# Patient Record
Sex: Male | Born: 1935 | Race: Black or African American | Hispanic: No | Smoking: Never smoker
Health system: Southern US, Community
[De-identification: ages and names within clinical notes are randomized; demographics above are authoritative.]

## PROBLEM LIST (undated history)

## (undated) DIAGNOSIS — I2510 Atherosclerotic heart disease of native coronary artery without angina pectoris: Secondary | ICD-10-CM

## (undated) DIAGNOSIS — M1990 Unspecified osteoarthritis, unspecified site: Secondary | ICD-10-CM

## (undated) DIAGNOSIS — I10 Essential (primary) hypertension: Secondary | ICD-10-CM

## (undated) DIAGNOSIS — E119 Type 2 diabetes mellitus without complications: Secondary | ICD-10-CM

## (undated) DIAGNOSIS — I639 Cerebral infarction, unspecified: Secondary | ICD-10-CM

---

## 2014-07-23 HISTORY — PX: OTHER SURGICAL HISTORY: SHX169

## 2018-09-09 ENCOUNTER — Encounter: Payer: Self-pay | Admitting: Gastroenterology

## 2018-09-09 ENCOUNTER — Telehealth: Payer: Self-pay

## 2018-09-09 NOTE — Telephone Encounter (Signed)
Called and scheduled

## 2018-09-09 NOTE — Telephone Encounter (Addendum)
Copied from CRM [id number]. Topic: Appointments - Schedule Appointment  >> Sep 09, 2018 11:59 AM Corlis Leak wrote:  Perlie Gold from [facility] calling to make a new patient appointment to be seen for rash.    Patient has BS insurance.    Please call Perlie Gold at [phone], ext. [phone] to schedule an appointment.

## 2018-10-20 ENCOUNTER — Telehealth: Payer: Self-pay

## 2018-10-20 ENCOUNTER — Other Ambulatory Visit: Payer: Self-pay | Admitting: Dermatology

## 2018-10-20 NOTE — Telephone Encounter (Signed)
Copied from CRM [id number]. Topic: Appointments - Reschedule Appointment  >> Oct 20, 2018  2:58 PM Pablo Ledger wrote:  Daughter is calling for the patient and asking if we can see this patient sooner than June.  He has a rash all over the body and on his inner thighs. It is especially bad on his bottom, states when he sits on the toilet, when he gets up there is a bunch of scabs on the seat and there is blood.   The rash is bleeding in some areas, please call to discuss . [phone] Gay Filler is his daughter.

## 2018-10-20 NOTE — Telephone Encounter (Signed)
Called patient daughter, offered telehome encounter given COVID pandemic. Patient's daughter is interested. She will send images. Told the patient's daughter we will reach back out to her after images arte reviewed for full history and recommendations.    Silver Huguenin, MD  Dermatology Resident  3:50 PM  10/20/18

## 2018-10-20 NOTE — Progress Notes (Signed)
Orders only encounter to facilitate image upload.    Silver Huguenin, MD  Dermatology Resident  3:51 PM  10/20/18

## 2018-10-29 ENCOUNTER — Telehealth: Payer: Self-pay | Admitting: MOHS-Micrographic Surgery

## 2018-10-29 NOTE — Telephone Encounter (Signed)
Patient called 3x to remind him to upload photo for rash. Was unable to reach patient during any attempts to perform telehome vist. After 7 days, no photo has been uploaded.     Per policy, will remove patient from triage list at this time and can add back on if patient calls back in the future.

## 2018-11-03 ENCOUNTER — Other Ambulatory Visit
Admission: RE | Admit: 2018-11-03 | Discharge: 2018-11-03 | Disposition: A | Discharge status: Home / Self Care | Payer: Medicare Other | Source: Ambulatory Visit | Attending: Family Medicine | Admitting: Family Medicine

## 2018-11-03 DIAGNOSIS — R21 Rash and other nonspecific skin eruption: Secondary | ICD-10-CM | POA: Insufficient documentation

## 2018-11-03 DIAGNOSIS — I10 Essential (primary) hypertension: Secondary | ICD-10-CM | POA: Insufficient documentation

## 2018-11-03 DIAGNOSIS — E119 Type 2 diabetes mellitus without complications: Secondary | ICD-10-CM | POA: Insufficient documentation

## 2018-11-03 LAB — CRP: CRP: 6 mg/L (ref 0–10)

## 2018-11-03 LAB — SEDIMENTATION RATE, AUTOMATED: Sedimentation Rate: 39 mm/hr — ABNORMAL HIGH (ref 0–20)

## 2018-11-03 LAB — GLUCOSE: Glucose: 129 mg/dL — ABNORMAL HIGH (ref 60–99)

## 2018-11-03 LAB — BASIC METABOLIC PANEL
Anion Gap: 11 (ref 7–16)
CO2: 26 mmol/L (ref 20–28)
Calcium: 9.1 mg/dL (ref 8.6–10.2)
Chloride: 104 mmol/L (ref 96–108)
Creatinine: 0.99 mg/dL (ref 0.67–1.17)
GFR,Black: 81 *
GFR,Caucasian: 70 *
Lab: 17 mg/dL (ref 6–20)
Potassium: 4.2 mmol/L (ref 3.3–5.1)
Sodium: 141 mmol/L (ref 133–145)

## 2018-11-03 LAB — HEMOGLOBIN A1C: Hemoglobin A1C: 6.9 % — ABNORMAL HIGH

## 2018-11-04 LAB — ANTINUCLEAR ANTIBODY SCREEN: ANA Screen: NEGATIVE

## 2018-11-06 ENCOUNTER — Telehealth: Payer: Self-pay

## 2018-11-06 NOTE — Telephone Encounter (Signed)
Copied from CRM [id number]. Topic: Appointments - Schedule Appointment  >> Nov 06, 2018 11:25 AM Filiberto Pinks wrote:  [facility] has a scheduled appointment on 06/03 with Dr. Bethann Berkshire. The patient would like a sooner appointment if possible to be seen for Possible Fungal infections on patients legs and arms and bottom, itching. [facility] can be reached at [phone].

## 2018-12-24 ENCOUNTER — Ambulatory Visit: Payer: Medicare Other | Attending: Dermatology | Admitting: Dermatology

## 2018-12-24 ENCOUNTER — Encounter: Payer: Self-pay | Admitting: Dermatology

## 2018-12-24 VITALS — Temp 98.5°F | Ht 69.0 in

## 2018-12-24 DIAGNOSIS — L408 Other psoriasis: Secondary | ICD-10-CM

## 2018-12-24 DIAGNOSIS — R21 Rash and other nonspecific skin eruption: Secondary | ICD-10-CM | POA: Insufficient documentation

## 2018-12-24 MED ORDER — TRIAMCINOLONE ACETONIDE 0.1 % EX OINT *I*
TOPICAL_OINTMENT | Freq: Two times a day (BID) | CUTANEOUS | 1 refills | Status: AC
Start: 2018-12-24 — End: ?

## 2018-12-24 NOTE — Patient Instructions (Signed)
Start triamcinolone 0.1% ointment, apply to all the scaly spots on your arms, lower back, and legs 1-2x/day   - you can stop this ointment if your rash goes away  This medication is a topical steroid. Steroid should generally not be used on the face, in armpits, or in other skin folds, unless specifically directed otherwise. Overuse of steroids can lead to thinning of the skin, appearance of red blood vessels, or discoloration of the skin. Use only as directed.       Biopsy   -Tissue was obtained and will be examined by a Dermatopathologist under the microscope to aid in diagnosis  -Our office will call you with results in 1-2 weeks, please call if you haven't heard back in 2 weeks.        Postoperative Care of the Biopsy site:     Keep the original dressing on and dry for 24-48 hours.   Remove the dressing and wash with mild soap and water.   Apply a thin layer of Vaseline/Aquaphor ointment (If you have steri-strips over the site do not use any ointment.)   Cover the wound with a Band-aid or a piece of Telfa held in place with a Band-Aid or tape.   The wound should be cleaned and the dressing changed once a day for one to two weeks.      Tylenol or Extra Strength Tylenol should relieve any pain you may have as a result of the biopsy.      If you experience bleeding, apply firm pressure with gauze over the area for 15 minutes.    For emergencies after hours or on the weekend call the [facility] office at [phone] and a provider will contact you.

## 2018-12-24 NOTE — Progress Notes (Addendum)
Referring Provider: No ref. provider found   PCP: Betteleywang, Odetta PinkMegan Elizabeth, MD     Chief complaint: New Patient Visit (dry itchy skin on back and legs )    Derm History: none    HPI:   [facility] is a pleasant 85 y.o. male with history of T2DM and CVA (last in 04/2018) here for the concern noted above.    Problem: itchy rash  Location: buttock, arms, and legs  Duration: he has had small annular plaques on left leg for a few years, but recently it as spread to extent it is today, since 06/2018  Associated signs/symptoms: Itching  Modifying factors (prior treatments/current treatment):   Was using a cream prescribed by PCP which he thinks is a triple antibiotic ointment, not helping  Medication history:    Skin: Caladrox, Skintegrity hydrophilic wound dressing, triple antibiotic ointment  Only new medication: gabapentin: fall 2019, discontinued in early May 2020    Personal hx of skin cancer: no  Family hx of skin cancer: no  Social History: retired, he worked at a [employer] shop  No flowsheet data found.   Smoking History:  reports that he has never smoked. He has never used smokeless tobacco.    ROS:   Integumentary ROS: positive for rash  Constitutional: Negative  Neuro/psych: negative    Physical Exam:    Vitals:    12/24/18 0945   Temp: 36.9 C (98.5 F)   Height: 1.753 m (5\' 9" )      Pain    12/24/18 0945   PainSc:   5   PainLoc: Leg      General: Appears well, NAD  Mood/Affect: Normal affect  Skin: All of the following were examined, and were within normal limits, except as noted: Face, Ears, Eyes/Eyelids, Lips, Neck, Chest, Back, Abdomen, Extremities (RUE/LUE), Extremities (RLE/LLE), Digits/Nails and Buttocks  Bilateral lower extremities and buttock: annular red plaques with significant desquamating scale on periphery. These plaques become confluent with overlying hyperkeratosis in the distal most part of the leg  Bilateral arms: scattered skin colored lichenified papules  KOH negative                                     Assessment/Plan:   Cutaneous eruption: bilateral lower legs, buttock, lumbar back, and arms  -DDx: LP vs psoriasis vs diffuse tinea vs drug induced eruption (timing indicated gabapentin but patient is also on many other possible offenders including Ace-inhibitor and CCB) vs less likely sarcoidosis or MF  -Shave biopsy today x2 (procedure note below); a) right lower back b) central lower back  -Wound care with vaseline/aquaphor and bandage discussed. Wound care instructions provided   -keep area covered for 24 hours, then remove bandage and wash as normal.  Keep wound moist with frequent vaseline/aquaphor thereafter.   -can leave open or cover with bandage to prevent vaseline getting on clothing.  - start triamcinolone 0.1% cream or ointment twice a day to all affected areas until biopsy results return   -steroid precaution in AVS or discussed with patient  -Further treatment based on pathology results    Barriers to learning: None    Return to Clinic: prn pending path    Darlina SicilianMolly Marous, MD  Dermatology Resident PGY2  12/24/2018 11:07 AM    Procedure Note: Shave Biopsy   -Site: a) left lower back b) central lower back  -After informed verbal consent was obtained (COVID),  chlorhexidine was used for cleansing and 1% Lidocaine with epinephrine was used for anesthetic. A Derm Blade was used to obtain a biopsy specimen of the lesion. Hemostasis was obtained with aluminum chloride.  Vaseline was applied, and wound care instructions provided. The specimen was labeled and sent to pathology for evaluation. The procedure was well tolerated without complication.  The patient will be contacted with biopsy results    I saw and evaluated the patient with the resident/NP. I agree with the history, findings and plan of care as documented. I was present for the key portions of the procedure. Diffuse annular rash; differential includes LP, lichenoid drug, psoriasis, other drug eruption, sarcoidosis, multifocal tinea (though  KOH negative), and less likely, MF. Shave biopsy obtained x2. Treat empirically with triamcinolone for now.    Adele Dan, MD  Dermatology Attending

## 2018-12-31 LAB — SURGICAL PATHOLOGY

## 2019-01-02 ENCOUNTER — Telehealth: Payer: Self-pay | Admitting: Student in an Organized Health Care Education/Training Program

## 2019-01-02 NOTE — Telephone Encounter (Signed)
Called patient and relayed biopsy results consistent with psoriasis    Asked patient how he is doing. He states he is very itchy.  He states his daughter has not been applying the cream as prescribed.   He inquires to confirm that the directions were twice daily (confirmed), and states he will tell his daughter to increase application accordingly.    Discussed that at follow up we will be able to see if/how much he improves with topical steroids and then  we can decide if he needs a different treatment.    Plan for follow up late July/Early August.  Will route to staff to schedule.      Juan Lark, MD

## 2019-01-06 NOTE — Telephone Encounter (Signed)
[facility] was called and scheduled with Dr. Azalee Course on 7/29 at 11AM.

## 2019-01-09 ENCOUNTER — Other Ambulatory Visit: Payer: Self-pay | Admitting: Family Medicine

## 2019-01-09 ENCOUNTER — Ambulatory Visit
Admission: RE | Admit: 2019-01-09 | Discharge: 2019-01-09 | Disposition: A | Discharge status: Home / Self Care | Payer: Medicare Other | Source: Ambulatory Visit | Attending: Vascular Surgery | Admitting: Vascular Surgery

## 2019-01-09 DIAGNOSIS — R2242 Localized swelling, mass and lump, left lower limb: Secondary | ICD-10-CM | POA: Insufficient documentation

## 2019-01-09 DIAGNOSIS — R609 Edema, unspecified: Secondary | ICD-10-CM | POA: Insufficient documentation

## 2019-02-02 ENCOUNTER — Other Ambulatory Visit
Admission: RE | Admit: 2019-02-02 | Discharge: 2019-02-02 | Disposition: A | Discharge status: Home / Self Care | Payer: Medicare Other | Source: Ambulatory Visit | Attending: Family Medicine | Admitting: Family Medicine

## 2019-02-02 DIAGNOSIS — E119 Type 2 diabetes mellitus without complications: Secondary | ICD-10-CM | POA: Insufficient documentation

## 2019-02-02 LAB — MICROALBUMIN, URINE, RANDOM
Creatinine,UR: 96 mg/dL (ref 20–300)
Microalb/Creat Ratio: 708.4 mg MA/g CR — ABNORMAL HIGH (ref 0.0–29.9)
Microalbumin,UR: 68.01 mg/dL

## 2019-02-18 ENCOUNTER — Telehealth: Payer: Self-pay | Admitting: Dermatology

## 2019-02-18 ENCOUNTER — Ambulatory Visit: Payer: BLUE CROSS/BLUE SHIELD | Admitting: Dermatology

## 2019-02-18 NOTE — Telephone Encounter (Signed)
Copied from [place] [id number]. Topic: Appointments - Reschedule Appointment  >> Feb 18, 2019  8:24 AM Ranae Pila wrote:  Mr. Turnbaugh is calling to cancel his appointment which is currently scheduled for Today 07/29.    Reason for the cancellation: Aid not available    Has the appointment been cancelled? yes    Has the appointment been rescheduled? no    Date of new appointment?Writer unable to reschedule, Request appointment before noon.     Does the patient need a call back to reschedule?yes    Patient can be contacted back at [phone]    Same Day Cancellation

## 2019-02-19 NOTE — Telephone Encounter (Signed)
[facility] was called to schedule an appt. Left vm to call back. Offering 8/5

## 2019-03-04 NOTE — Telephone Encounter (Signed)
[facility] was called and scheduled with Dr. Azalee Course on 9/4 at 9:40AM.

## 2019-03-27 ENCOUNTER — Ambulatory Visit: Payer: BLUE CROSS/BLUE SHIELD | Admitting: Dermatology

## 2019-08-15 ENCOUNTER — Emergency Department (HOSPITAL_COMMUNITY): Payer: Medicare Other

## 2019-08-15 ENCOUNTER — Other Ambulatory Visit: Payer: Self-pay

## 2019-08-15 ENCOUNTER — Inpatient Hospital Stay (HOSPITAL_COMMUNITY)
Admission: EM | Admit: 2019-08-15 | Discharge: 2019-08-16 | DRG: 061 | Disposition: A | Discharge status: Hospice | Payer: Medicare Other | Attending: Neurology | Admitting: Neurology

## 2019-08-15 ENCOUNTER — Encounter (HOSPITAL_COMMUNITY): Payer: Self-pay | Admitting: Emergency Medicine

## 2019-08-15 ENCOUNTER — Inpatient Hospital Stay (HOSPITAL_COMMUNITY): Payer: Medicare Other

## 2019-08-15 DIAGNOSIS — Z66 Do not resuscitate: Secondary | ICD-10-CM | POA: Diagnosis not present

## 2019-08-15 DIAGNOSIS — I2510 Atherosclerotic heart disease of native coronary artery without angina pectoris: Secondary | ICD-10-CM | POA: Diagnosis present

## 2019-08-15 DIAGNOSIS — G460 Middle cerebral artery syndrome: Secondary | ICD-10-CM | POA: Diagnosis present

## 2019-08-15 DIAGNOSIS — Z86718 Personal history of other venous thrombosis and embolism: Secondary | ICD-10-CM

## 2019-08-15 DIAGNOSIS — I63512 Cerebral infarction due to unspecified occlusion or stenosis of left middle cerebral artery: Secondary | ICD-10-CM | POA: Diagnosis present

## 2019-08-15 DIAGNOSIS — Z20822 Contact with and (suspected) exposure to covid-19: Secondary | ICD-10-CM | POA: Diagnosis present

## 2019-08-15 DIAGNOSIS — R4701 Aphasia: Secondary | ICD-10-CM | POA: Diagnosis present

## 2019-08-15 DIAGNOSIS — I351 Nonrheumatic aortic (valve) insufficiency: Secondary | ICD-10-CM | POA: Diagnosis not present

## 2019-08-15 DIAGNOSIS — R29810 Facial weakness: Secondary | ICD-10-CM | POA: Diagnosis present

## 2019-08-15 DIAGNOSIS — Y92009 Unspecified place in unspecified non-institutional (private) residence as the place of occurrence of the external cause: Secondary | ICD-10-CM

## 2019-08-15 DIAGNOSIS — I2690 Septic pulmonary embolism without acute cor pulmonale: Secondary | ICD-10-CM | POA: Diagnosis not present

## 2019-08-15 DIAGNOSIS — I639 Cerebral infarction, unspecified: Secondary | ICD-10-CM | POA: Diagnosis present

## 2019-08-15 DIAGNOSIS — I160 Hypertensive urgency: Secondary | ICD-10-CM | POA: Diagnosis present

## 2019-08-15 DIAGNOSIS — E1165 Type 2 diabetes mellitus with hyperglycemia: Secondary | ICD-10-CM | POA: Diagnosis present

## 2019-08-15 DIAGNOSIS — R29728 NIHSS score 28: Secondary | ICD-10-CM | POA: Diagnosis present

## 2019-08-15 DIAGNOSIS — G936 Cerebral edema: Secondary | ICD-10-CM | POA: Diagnosis present

## 2019-08-15 DIAGNOSIS — I350 Nonrheumatic aortic (valve) stenosis: Secondary | ICD-10-CM

## 2019-08-15 DIAGNOSIS — Z91048 Other nonmedicinal substance allergy status: Secondary | ICD-10-CM

## 2019-08-15 DIAGNOSIS — I10 Essential (primary) hypertension: Secondary | ICD-10-CM

## 2019-08-15 DIAGNOSIS — I69391 Dysphagia following cerebral infarction: Secondary | ICD-10-CM | POA: Diagnosis not present

## 2019-08-15 DIAGNOSIS — I2699 Other pulmonary embolism without acute cor pulmonale: Secondary | ICD-10-CM

## 2019-08-15 DIAGNOSIS — M1990 Unspecified osteoarthritis, unspecified site: Secondary | ICD-10-CM | POA: Diagnosis present

## 2019-08-15 DIAGNOSIS — I619 Nontraumatic intracerebral hemorrhage, unspecified: Secondary | ICD-10-CM | POA: Diagnosis present

## 2019-08-15 DIAGNOSIS — T45615A Adverse effect of thrombolytic drugs, initial encounter: Secondary | ICD-10-CM | POA: Diagnosis present

## 2019-08-15 DIAGNOSIS — E785 Hyperlipidemia, unspecified: Secondary | ICD-10-CM | POA: Diagnosis present

## 2019-08-15 DIAGNOSIS — N189 Chronic kidney disease, unspecified: Secondary | ICD-10-CM | POA: Diagnosis present

## 2019-08-15 DIAGNOSIS — I129 Hypertensive chronic kidney disease with stage 1 through stage 4 chronic kidney disease, or unspecified chronic kidney disease: Secondary | ICD-10-CM | POA: Diagnosis present

## 2019-08-15 DIAGNOSIS — Z993 Dependence on wheelchair: Secondary | ICD-10-CM

## 2019-08-15 DIAGNOSIS — I63012 Cerebral infarction due to thrombosis of left vertebral artery: Secondary | ICD-10-CM | POA: Diagnosis not present

## 2019-08-15 DIAGNOSIS — I361 Nonrheumatic tricuspid (valve) insufficiency: Secondary | ICD-10-CM

## 2019-08-15 DIAGNOSIS — R1310 Dysphagia, unspecified: Secondary | ICD-10-CM | POA: Diagnosis present

## 2019-08-15 DIAGNOSIS — R0902 Hypoxemia: Secondary | ICD-10-CM

## 2019-08-15 DIAGNOSIS — Z515 Encounter for palliative care: Secondary | ICD-10-CM | POA: Diagnosis not present

## 2019-08-15 DIAGNOSIS — I69351 Hemiplegia and hemiparesis following cerebral infarction affecting right dominant side: Secondary | ICD-10-CM

## 2019-08-15 DIAGNOSIS — Z9114 Patient's other noncompliance with medication regimen: Secondary | ICD-10-CM | POA: Diagnosis not present

## 2019-08-15 DIAGNOSIS — G8194 Hemiplegia, unspecified affecting left nondominant side: Secondary | ICD-10-CM | POA: Diagnosis present

## 2019-08-15 DIAGNOSIS — Z7982 Long term (current) use of aspirin: Secondary | ICD-10-CM

## 2019-08-15 DIAGNOSIS — Z91128 Patient's intentional underdosing of medication regimen for other reason: Secondary | ICD-10-CM

## 2019-08-15 DIAGNOSIS — G935 Compression of brain: Secondary | ICD-10-CM | POA: Diagnosis present

## 2019-08-15 DIAGNOSIS — Z823 Family history of stroke: Secondary | ICD-10-CM

## 2019-08-15 DIAGNOSIS — T45516A Underdosing of anticoagulants, initial encounter: Secondary | ICD-10-CM | POA: Diagnosis present

## 2019-08-15 HISTORY — DX: Unspecified osteoarthritis, unspecified site: M19.90

## 2019-08-15 HISTORY — DX: Cerebral infarction, unspecified: I63.9

## 2019-08-15 HISTORY — DX: Atherosclerotic heart disease of native coronary artery without angina pectoris: I25.10

## 2019-08-15 HISTORY — DX: Essential (primary) hypertension: I10

## 2019-08-15 HISTORY — DX: Type 2 diabetes mellitus without complications: E11.9

## 2019-08-15 LAB — I-STAT CHEM 8, ED
BUN: 22 mg/dL (ref 8–23)
Calcium, Ion: 1.07 mmol/L — ABNORMAL LOW (ref 1.15–1.40)
Chloride: 108 mmol/L (ref 98–111)
Creatinine, Ser: 1.4 mg/dL — ABNORMAL HIGH (ref 0.61–1.24)
Glucose, Bld: 341 mg/dL — ABNORMAL HIGH (ref 70–99)
HCT: 43 % (ref 39.0–52.0)
Hemoglobin: 14.6 g/dL (ref 13.0–17.0)
Potassium: 3.8 mmol/L (ref 3.5–5.1)
Sodium: 142 mmol/L (ref 135–145)
TCO2: 21 mmol/L — ABNORMAL LOW (ref 22–32)

## 2019-08-15 LAB — CBC
HCT: 42.6 % (ref 39.0–52.0)
Hemoglobin: 14.1 g/dL (ref 13.0–17.0)
MCH: 30.7 pg (ref 26.0–34.0)
MCHC: 33.1 g/dL (ref 30.0–36.0)
MCV: 92.8 fL (ref 80.0–100.0)
Platelets: 229 10*3/uL (ref 150–400)
RBC: 4.59 MIL/uL (ref 4.22–5.81)
RDW: 15.9 % — ABNORMAL HIGH (ref 11.5–15.5)
WBC: 11.3 10*3/uL — ABNORMAL HIGH (ref 4.0–10.5)
nRBC: 0.6 % — ABNORMAL HIGH (ref 0.0–0.2)

## 2019-08-15 LAB — DIFFERENTIAL
Abs Immature Granulocytes: 0.09 10*3/uL — ABNORMAL HIGH (ref 0.00–0.07)
Basophils Absolute: 0.1 10*3/uL (ref 0.0–0.1)
Basophils Relative: 1 %
Eosinophils Absolute: 0 10*3/uL (ref 0.0–0.5)
Eosinophils Relative: 0 %
Immature Granulocytes: 1 %
Lymphocytes Relative: 19 %
Lymphs Abs: 2.1 10*3/uL (ref 0.7–4.0)
Monocytes Absolute: 1 10*3/uL (ref 0.1–1.0)
Monocytes Relative: 9 %
Neutro Abs: 8 10*3/uL — ABNORMAL HIGH (ref 1.7–7.7)
Neutrophils Relative %: 70 %

## 2019-08-15 LAB — RESPIRATORY PANEL BY RT PCR (FLU A&B, COVID)
Influenza A by PCR: NEGATIVE
Influenza B by PCR: NEGATIVE
SARS Coronavirus 2 by RT PCR: NEGATIVE

## 2019-08-15 LAB — GLUCOSE, CAPILLARY
Glucose-Capillary: 354 mg/dL — ABNORMAL HIGH (ref 70–99)
Glucose-Capillary: 376 mg/dL — ABNORMAL HIGH (ref 70–99)

## 2019-08-15 LAB — COMPREHENSIVE METABOLIC PANEL
ALT: 13 U/L (ref 0–44)
AST: 18 U/L (ref 15–41)
Albumin: 2.9 g/dL — ABNORMAL LOW (ref 3.5–5.0)
Alkaline Phosphatase: 71 U/L (ref 38–126)
Anion gap: 13 (ref 5–15)
BUN: 21 mg/dL (ref 8–23)
CO2: 19 mmol/L — ABNORMAL LOW (ref 22–32)
Calcium: 8.7 mg/dL — ABNORMAL LOW (ref 8.9–10.3)
Chloride: 107 mmol/L (ref 98–111)
Creatinine, Ser: 1.61 mg/dL — ABNORMAL HIGH (ref 0.61–1.24)
GFR calc Af Amer: 45 mL/min — ABNORMAL LOW (ref >60)
GFR calc non Af Amer: 39 mL/min — ABNORMAL LOW (ref >60)
Glucose, Bld: 351 mg/dL — ABNORMAL HIGH (ref 70–99)
Potassium: 3.9 mmol/L (ref 3.5–5.1)
Sodium: 139 mmol/L (ref 135–145)
Total Bilirubin: 1.1 mg/dL (ref 0.3–1.2)
Total Protein: 7 g/dL (ref 6.5–8.1)

## 2019-08-15 LAB — PROTIME-INR
INR: 1.3 — ABNORMAL HIGH (ref 0.8–1.2)
Prothrombin Time: 15.7 seconds — ABNORMAL HIGH (ref 11.4–15.2)

## 2019-08-15 LAB — CBG MONITORING, ED: Glucose-Capillary: 327 mg/dL — ABNORMAL HIGH (ref 70–99)

## 2019-08-15 LAB — ECHOCARDIOGRAM COMPLETE: Weight: 3393.32 oz

## 2019-08-15 LAB — HEMOGLOBIN A1C
Hgb A1c MFr Bld: 9.5 % — ABNORMAL HIGH (ref 4.8–5.6)
Mean Plasma Glucose: 225.95 mg/dL

## 2019-08-15 LAB — BRAIN NATRIURETIC PEPTIDE: B Natriuretic Peptide: 440.6 pg/mL — ABNORMAL HIGH (ref 0.0–100.0)

## 2019-08-15 LAB — MRSA PCR SCREENING: MRSA by PCR: NEGATIVE

## 2019-08-15 LAB — HEMOGLOBIN AND HEMATOCRIT, BLOOD
HCT: 42.4 % (ref 39.0–52.0)
Hemoglobin: 14.4 g/dL (ref 13.0–17.0)

## 2019-08-15 LAB — TSH: TSH: 1.851 u[IU]/mL (ref 0.350–4.500)

## 2019-08-15 LAB — APTT: aPTT: 28 seconds (ref 24–36)

## 2019-08-15 LAB — TROPONIN I (HIGH SENSITIVITY)
Troponin I (High Sensitivity): 204 ng/L (ref <18)
Troponin I (High Sensitivity): 591 ng/L (ref <18)

## 2019-08-15 LAB — T4, FREE: Free T4: 1.6 ng/dL — ABNORMAL HIGH (ref 0.61–1.12)

## 2019-08-15 MED ORDER — CHLORHEXIDINE GLUCONATE 0.12 % MT SOLN
15.0000 mL | Freq: Two times a day (BID) | OROMUCOSAL | Status: DC
  Administered 2019-08-15 – 2019-08-16 (×2): 15 mL via OROMUCOSAL

## 2019-08-15 MED ORDER — LACTATED RINGERS IV SOLN: INTRAVENOUS | Status: DC

## 2019-08-15 MED ORDER — INSULIN DETEMIR 100 UNIT/ML ~~LOC~~ SOLN
10.0000 [IU] | Freq: Two times a day (BID) | SUBCUTANEOUS | Status: DC
  Administered 2019-08-15: 10 [IU] via SUBCUTANEOUS
  Filled 2019-08-15 (×3): qty 0.1

## 2019-08-15 MED ORDER — CHLORHEXIDINE GLUCONATE CLOTH 2 % EX PADS
6.0000 | MEDICATED_PAD | Freq: Every day | CUTANEOUS | Status: DC
  Administered 2019-08-15: 6 via TOPICAL

## 2019-08-15 MED ORDER — IOHEXOL 350 MG/ML SOLN
75.0000 mL | Freq: Once | INTRAVENOUS | Status: AC | PRN
  Administered 2019-08-15: 75 mL via INTRAVENOUS

## 2019-08-15 MED ORDER — LABETALOL HCL 5 MG/ML IV SOLN
20.0000 mg | Freq: Once | INTRAVENOUS | Status: AC
  Administered 2019-08-15: 20 mg via INTRAVENOUS

## 2019-08-15 MED ORDER — ACETAMINOPHEN 160 MG/5ML PO SOLN: 650.0000 mg | ORAL | Status: DC | PRN

## 2019-08-15 MED ORDER — SODIUM CHLORIDE 0.9 % IV SOLN
50.0000 mL | Freq: Once | INTRAVENOUS | Status: AC
  Administered 2019-08-15: 50 mL via INTRAVENOUS

## 2019-08-15 MED ORDER — CLEVIDIPINE BUTYRATE 0.5 MG/ML IV EMUL
0.0000 mg/h | INTRAVENOUS | Status: DC
  Administered 2019-08-15: 2 mg/h via INTRAVENOUS
  Administered 2019-08-15: 18 mg/h via INTRAVENOUS
  Administered 2019-08-15 (×3): 10 mg/h via INTRAVENOUS
  Administered 2019-08-16: 5 mg/h via INTRAVENOUS
  Administered 2019-08-16: 8 mg/h via INTRAVENOUS
  Filled 2019-08-15 (×2): qty 50
  Filled 2019-08-15: qty 100
  Filled 2019-08-15 (×3): qty 50

## 2019-08-15 MED ORDER — ACETAMINOPHEN 325 MG PO TABS: 650.0000 mg | ORAL_TABLET | ORAL | Status: DC | PRN

## 2019-08-15 MED ORDER — SODIUM CHLORIDE 0.9 % IV SOLN
50.0000 mL/h | INTRAVENOUS | Status: DC
  Administered 2019-08-15 (×2): 50 mL/h via INTRAVENOUS

## 2019-08-15 MED ORDER — CLEVIDIPINE BUTYRATE 0.5 MG/ML IV EMUL
INTRAVENOUS | Status: AC
  Filled 2019-08-15: qty 50

## 2019-08-15 MED ORDER — PANTOPRAZOLE SODIUM 40 MG IV SOLR
40.0000 mg | Freq: Every day | INTRAVENOUS | Status: DC
  Administered 2019-08-15: 40 mg via INTRAVENOUS
  Filled 2019-08-15: qty 40

## 2019-08-15 MED ORDER — ORAL CARE MOUTH RINSE
15.0000 mL | Freq: Two times a day (BID) | OROMUCOSAL | Status: DC
  Administered 2019-08-15: 15 mL via OROMUCOSAL

## 2019-08-15 MED ORDER — INSULIN ASPART 100 UNIT/ML ~~LOC~~ SOLN
0.0000 [IU] | SUBCUTANEOUS | Status: DC
  Administered 2019-08-15: 8 [IU] via SUBCUTANEOUS
  Administered 2019-08-15 (×2): 15 [IU] via SUBCUTANEOUS
  Administered 2019-08-16: 3 [IU] via SUBCUTANEOUS

## 2019-08-15 MED ORDER — ALTEPLASE (STROKE) FULL DOSE INFUSION
0.9000 mg/kg | Freq: Once | INTRAVENOUS | Status: AC
  Administered 2019-08-15: 86.6 mg via INTRAVENOUS
  Filled 2019-08-15: qty 100

## 2019-08-15 MED ORDER — SODIUM CHLORIDE 0.9% FLUSH
3.0000 mL | Freq: Once | INTRAVENOUS | Status: DC
Start: 2019-08-15 — End: 2019-08-16

## 2019-08-15 MED ORDER — ACETAMINOPHEN 650 MG RE SUPP
650.0000 mg | RECTAL | Status: DC | PRN
  Administered 2019-08-16: 650 mg via RECTAL
  Filled 2019-08-15: qty 1

## 2019-08-15 MED ORDER — STROKE: EARLY STAGES OF RECOVERY BOOK: Freq: Once | Status: DC

## 2019-08-15 NOTE — Progress Notes (Signed)
RN entered room and found pt to have dark brownish red blood on pts beard down his neck and on his sheets. ~29ml. Neuro status unchanged. MD paged. Order for stat H and H given. Will continue to monitor.

## 2019-08-15 NOTE — Progress Notes (Signed)
Pharmacist Code Stroke Response  Notified to mix tPA at 1137 Delivered tPA to RN at 1141  tPA dose = 8.7 mg bolus over 1 minute followed by 77.9 mg for a total dose of 86.6 mg over 1 hour  Issues/delays encountered (if applicable): Alaris pump failed, had to get a new pump and channel. Patient's BP remained elevated despite labetalol and cleviprex, waited for BP to drop to ok range.  Paul Arroyo 08/15/19 12:01 PM

## 2019-08-15 NOTE — H&P (Addendum)
NEURO HOSPITALIST  H&P   Requesting Physician: Dr. Jeraldine Loots    Chief Complaint: right facial droop/ left side weakness  History obtained from: daughter/ EMS HPI:                                                                                                                                         Paul Arroyo is an 86 y.o. male  PMH hx  CVA  ( old right side deficits) Clots( not on coumadin), DM, HTN who presented to [facility] ED as a code stroke for right facial droop and left side weakness.   Per daughter patient recently moved to [place] to live with her fro [place]. Patient has had multiple CVAs, and has not been able to move his right side since the first stroke. Patient was on coumadin for blood clots ( she is not sure where), but per her report has been off coumadin for the past year. Per daughter patient had trouble swallowing at baseline, was wheelchair bound and unable to perform ADLs wears disposable briefs. Never a smoker, does not drink or do drugs. Very non compliant with medications (but cannot remember all of patient's medications)  he received care at [facility] and [facility] ( attempting to obtain medical records)  ED course:  CTH: no hemorrhage, hyperdense left MCA CTA: acute occlusion left MCA with poor MCA collateral Initial BP 194/129, CBG: 347  placed on 5L oxygen via [place] to keep 02 > 93% labetalol 20mg  given cardene drip titrated to starting BP of 164/92 ( manual)   Date last known well: 08/15/19 Time last known well: 0900 tPA Given: yes; started at 1153 ; delayed d/t difficulty controlling BP Modified Rankin: Rankin Score=4 NIHSS:28     Past Medical History:  Diagnosis Date  . Arthritis   . Coronary artery disease   . Diabetes mellitus without complication (HCC)   . Hypertension   . Stroke [facility])     Past Surgical History:  Procedure Laterality Date  . right knee replacement Right 2016   FHx:   Mother - CVA        Social History:  reports that he has never smoked. He has never used smokeless tobacco. He reports previous alcohol use. He reports that he does not use drugs.  Allergies: No Known Allergies  Medications:  Current Facility-Administered Medications  Medication Dose Route Frequency Provider Last Rate Last Admin  .  stroke: mapping our early stages of recovery book   Does not apply Once Greta Doom, MD      . alteplase (ACTIVASE) 1 mg/mL infusion 86.6 mg  0.9 mg/kg Intravenous Once Harvel Quale, RPH 86.6 mL/hr at 08/15/19 1153 86.6 mg at 08/15/19 1153   Followed by  . 0.9 %  sodium chloride infusion  50 mL Intravenous Once Masters, Jake Church, RPH      . 0.9 %  sodium chloride infusion  50 mL/hr Intravenous Continuous Greta Doom, MD      . acetaminophen (TYLENOL) tablet 650 mg  650 mg Oral Q4H PRN Greta Doom, MD       Or  . acetaminophen (TYLENOL) 160 MG/5ML solution 650 mg  650 mg Per Tube Q4H PRN Greta Doom, MD       Or  . acetaminophen (TYLENOL) suppository 650 mg  650 mg Rectal Q4H PRN Greta Doom, MD      . clevidipine (CLEVIPREX) 0.5 MG/ML infusion           . clevidipine (CLEVIPREX) infusion 0.5 mg/mL  0-21 mg/hr Intravenous Continuous Harvel Quale, RPH 16 mL/hr at 08/15/19 1146 8 mg/hr at 08/15/19 1146  . pantoprazole (PROTONIX) injection 40 mg  40 mg Intravenous QHS Greta Doom, MD      . sodium chloride flush (NS) 0.9 % injection 3 mL  3 mL Intravenous Once Carmin Muskrat, MD       No current outpatient medications on file.     ROS:                                                                                                                                       History obtained from unobtainable from patient due to mental status  General Examination:                                                                                                       Blood pressure (!) 160/95, pulse (!) 122, resp. rate 15, weight 96.2 kg, SpO2 90 %.  Physical Exam  Constitutional: Appears well-developed and well-nourished.  Psych: Affect appropriate to situation Eyes: Normal external eye and conjunctiva. HENT: Normocephalic, no lesions, without obvious abnormality.   Musculoskeletal- BLE pitting edema Cardiovascular: Normal rate and regular rhythm.  Respiratory: Effort normal, increased WOB,  saturations WNL on 5 L Major  GI: Soft.  No distension. There is no tenderness.  Skin: WDI  Neurological Examination Mental Status: Alert, not oriented, aphasic, not able to follow commands. Left forced gaze Cranial Nerves: Complete hemianopsia with forced gaze to the left, not blinking to threat from either side. pupils equal, round, reactive to light, right facial droop.  Motor/sensory: RUE and RLE no movement. LUE and LLE good strength with drift in LLE. Neglecting right side. Moved left side to noxious Tone and bulk:normal tone throughout; no atrophy noted Deep Tendon Reflexes: 1+ and symmetric biceps and patella Plantars: Right: downgoing   Left: downgoing Cerebellar: UTA Gait: not tested   Lab Results: Basic Metabolic Panel: Recent Labs  Lab 08/15/19 1115 08/15/19 1121  NA 139 142  K 3.9 3.8  CL 107 108  CO2 19*  --   GLUCOSE 351* 341*  BUN 21 22  CREATININE 1.61* 1.40*  CALCIUM 8.7*  --     CBC: Recent Labs  Lab 08/15/19 1115 08/15/19 1121  WBC 11.3*  --   NEUTROABS 8.0*  --   HGB 14.1 14.6  HCT 42.6 43.0  MCV 92.8  --   PLT 229  --     CBG: Recent Labs  Lab 08/15/19 1156  GLUCAP 327*    Imaging: CT ANGIO NECK W OR WO CONTRAST  Result Date: 08/15/2019 CLINICAL DATA:  Stroke EXAM: CT ANGIOGRAPHY HEAD AND NECK TECHNIQUE: Multidetector CT imaging of the head and neck was performed using the standard protocol during bolus  administration of intravenous contrast. Multiplanar CT image reconstructions and MIPs were obtained to evaluate the vascular anatomy. Carotid stenosis measurements (when applicable) are obtained utilizing NASCET criteria, using the distal internal carotid diameter as the denominator. CONTRAST:  9mL OMNIPAQUE IOHEXOL 350 MG/ML SOLN 75 mL Omnipaque 350 IV COMPARISON:  CT head 08/15/2019 FINDINGS: CTA NECK FINDINGS Aortic arch: Extensive atherosclerotic calcification aortic arch without dissection or aneurysm. Mild stenosis proximal left subclavian artery due to calcific stenosis. Right carotid system: Mild atherosclerotic calcification right carotid bifurcation without significant stenosis. Left carotid system: Left common carotid artery widely patent. Mild atherosclerotic disease left carotid bifurcation without significant stenosis. There is irregularity and decreased caliber of the left internal carotid artery as it approaches the skull base. Moderate to severe stenosis at the skull base. Severe calcific stenosis of the left cavernous and supraclinoid internal carotid artery on the left. Vertebral arteries: Both vertebral arteries patent to the basilar. Moderate stenosis in the mid vertebral artery bilaterally due to atherosclerotic disease. Skeleton: Cervical spondylosis. Negative for fracture. Patchy sclerotic changes in the vertebra diffusely. Sclerotic changes in the mandible bilaterally. Other neck: Moderate to marked enlargement of the left thyroid. Due to early arterial phase scanning, evaluation for nodule is difficult. There does appear to be at least a 15 mm nodule in the left lobe. Left lobe extends into the superior mediastinum. Right lobe mildly enlarged. Upper chest: Acute pulmonary embolism. Right pulmonary artery not scanned. Moderate pulmonary embolism left main pulmonary artery. Review of the MIP images confirms the above findings CTA HEAD FINDINGS Anterior circulation: Atherosclerotic  calcification and moderate stenosis right internal carotid artery in the cavernous and supraclinoid segment. Right middle cerebral artery widely patent. Right anterior cerebral artery widely patent Small caliber left cavernous carotid which is diffusely diseased. Severe calcific stenosis terminal left internal carotid artery. Acute occlusion left M1 segment. Poor collateral circulation with no filling of left MCA branches. Left anterior cerebral artery patent. Posterior circulation: Both vertebral arteries patent to the  basilar. Mild stenosis distal vertebral artery bilaterally. PICA patent bilaterally. Basilar widely patent. Posterior cerebral arteries patent bilaterally without stenosis. Venous sinuses: Limited venous contrast due to arterial phase scanning Anatomic variants: None Review of the MIP images confirms the above findings IMPRESSION: 1. Acute occlusion left MCA with poor MCA collateral circulation. In addition, there is diffuse disease of the left internal carotid artery at the skull base and left cavernous segment which may be due to atherosclerotic disease or thrombus. Severe calcific stenosis left cavernous and supraclinoid internal carotid artery. Mild stenosis left internal carotid artery proximally. 2. Mild atherosclerotic disease right carotid bifurcation. Moderate calcific stenosis right cavernous and supraclinoid internal carotid artery. Right middle and right anterior cerebral arteries widely patent. 3. Posterior circulation widely patent 4. Acute pulmonary embolism. Moderate amount of clot in left pulmonary artery. Right pulmonary artery not well evaluated due to patient positioning during the scan. 5. Bilateral thyroid enlargement left greater than right. This may be due to goiter versus neoplasm. Thyroid ultrasound recommended for further evaluation 6. These results were called by telephone at the time of interpretation on 08/15/2019 at 11:52 Pm to provider Amada Jupiter MD, who verbally  acknowledged these results. 7. Sclerotic changes throughout the spine which could be degenerative however metastatic disease is possible. Sclerotic changes in the mandible bilaterally. This may be due to chronic osteitis from dental disease. The patient is edentulous. Correlate with PSA. Electronically Signed   By: Marlan Palau M.D.   On: 08/15/2019 12:09   CT HEAD CODE STROKE WO CONTRAST  Result Date: 08/15/2019 CLINICAL DATA:  Code stroke.  Acute neuro deficit.  History stroke EXAM: CT HEAD WITHOUT CONTRAST TECHNIQUE: Contiguous axial images were obtained from the base of the skull through the vertex without intravenous contrast. COMPARISON:  None. FINDINGS: Brain: Hypodensity right parietal lobe consistent with chronic infarct. Chronic infarcts in the cerebellum bilaterally left greater than right. Chronic infarct left lateral pons. Generalized atrophy. No acute infarct. Negative for acute hemorrhage or mass. Vascular: Hyperdense left MCA consistent with acute occlusion and thrombus. Atherosclerotic calcification in the carotid arteries bilaterally. Calcification extends into the right M1 segment. Skull: Negative Sinuses/Orbits: Mild mucosal edema paranasal sinuses. Other: None ASPECTS (Alberta Stroke Program Early CT Score) - Ganglionic level infarction (caudate, lentiform nuclei, internal capsule, insula, M1-M3 cortex): 7 - Supraganglionic infarction (M4-M6 cortex): 3 Total score (0-10 with 10 being normal): 10 IMPRESSION: 1. Negative for acute infarct or hemorrhage 2. ASPECTS is 10 3. Chronic infarcts in the right parietal lobe and in cerebellum bilaterally. 4. Hyperdense left MCA compatible with acute thrombus. 5. These results were called by telephone at the time of interpretation on 08/15/2019 at 11:30 am to provider Amada Jupiter MD, who verbally acknowledged these results. Electronically Signed   By: Marlan Palau M.D.   On: 08/15/2019 11:32       Valentina Lucks, MSN, NP-C [facility] [phone]  08/15/2019, 12:23 PM   Attending physician note to follow with Assessment and plan .  I have seen the patient reviewed the above note.  Assessment: 86 y.o. male with a history of unknown indication for Coumadin who was noncompliant with the Coumadin who presents with large pulmonary embolus and left MCA occlusion.  Likely paradoxical embolus given PE, though he has significant carotid disease as well.  He has terrible collateralization on the affected side, which makes me suspect that he is going to have a very large, devastating infarct.  He was not a candidate for interventional mechanical thrombectomy due  to his poor baseline modified Rankin of 4.  He is being admitted for post TPA monitoring.  We are currently working on getting his medical records from [place].  I had a discussion with the daughter about what his wishes would be in the setting, at the current time he is DO NOT RESUSCITATE/DO NOT INTUBATE in the event of a cardiopulmonary arrest, however full support up to that point.  Stroke Risk Factors - diabetes mellitus and hypertension    CVA:  MRI high intensity Statin if LDL > 70 Frequent neuro monitoring PT/ OT/Speech Tele monitoring ECHO   PE: Received TPA Appreciate assistance from CCM I would typically not do anticoagulation in the setting of such a large stroke and post TPA for 24 hours, but the patient becomes hemodynamically unstable then it could be started 4 hours after TPA Monitor O2 sat  Nutrition: NPO until passes swallow with speech   DM: SSI Hgb A1c goal < 7  HTN: cardene drip to keep BP < 180/ 105    DVT: SCD's only Code status : DNR Disposition: TBD  --please page stroke NP  Or  PA  Or MD from 8am -4 pm  as this patient from this time will be  followed by the stroke.   You can look them up on [email or web address]  Password [email or web address] This patient is critically ill and at significant risk of neurological worsening,  death and care requires constant monitoring of vital signs, hemodynamics,respiratory and cardiac monitoring, neurological assessment, discussion with family, other specialists and medical decision making of high complexity. I spent 45 minutes of neurocritical care time  in the care of  this patient. This was time spent independent of any time provided by nurse practitioner or PA.  Ritta SlotMcNeill Kerrion Kemppainen, MD [facility] [phone]  If 7pm- 7am, please page neurology on call as listed in AMION. 08/15/2019  1:30 PM

## 2019-08-15 NOTE — Consult Note (Signed)
Responded to page that pt's daughter was in room with him. Encountered daughter leaving room, offered prayer. Daughter was appreciative but said she'd been praying, and her mother at home,  they'd also welcome any prayer I'd offe for pt. Before she got on phone, I assured her I'd pray for him, and gave her two small ceramic hearts for her and her mother to hold as they prayed for pt. She thanked me, and knows she can ask staff to page again if there is further need of chaplain services.   Rev. Donnel Saxon Chaplain

## 2019-08-15 NOTE — ED Provider Notes (Signed)
[place] EMERGENCY DEPARTMENT Provider Note   CSN: [id number] Arrival date & time: 08/15/19  1113     History No chief complaint on file.   Paul Arroyo is a 86 y.o. male.  HPI     Patient presents via EMS as a code stroke. History is obtained by EMS providers, and from our neurology colleague who spoke with the patient's daughter after he arrived. Reported patient has a history of prior pontine hemorrhagic stroke, as well as ongoing anticoagulation which is not currently taking.  He was seemingly in his usual state of health until 2.5 hours prior to ED arrival.  On about that time he developed aphasia, decreased interactivity. 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 Level 5 caveat secondary to acuity of condition and nonverbal status.  No past medical history on file. History not available patient is aphasic level 5 caveat.   No family history on file.  Social History   Tobacco Use  . Smoking status: Not on file  Substance Use Topics  . Alcohol use: Not on file  . Drug use: Not on file    Home Medications Prior to Admission medications   Not on File    Allergies    Patient has no allergy information on record.  Review of Systems   Review of Systems  Unable to perform ROS: Patient nonverbal    Physical Exam Updated Vital Signs Wt 96.2 kg   Physical Exam Vitals and nursing note reviewed.  Constitutional:      General: He is not in acute distress.    Appearance: He is ill-appearing. He is not toxic-appearing.     Comments: Ill-appearing adult male noninteractive with head tilted to the right, gross hemiplegia on the right  HENT:     Head: Normocephalic and atraumatic.  Eyes:     Conjunctiva/sclera: Conjunctivae normal.  Cardiovascular:     Rate and Rhythm: Normal rate and regular rhythm.  Pulmonary:     Effort: Pulmonary effort is normal. No respiratory distress.     Breath sounds: No stridor.    Abdominal:     General: There is no distension.  Musculoskeletal:       Legs:  Skin:    General: Skin is warm and dry.  Neurological:     Comments: Noninteractive, patient is nonverbal, gross right-sided hemiplegia, left-sided strength is 3/5, that he does follow commands, inconsistently.  Psychiatric:        Cognition and Memory: Cognition is impaired.     ED Results / Procedures / Treatments   Labs (all labs ordered are listed, but only abnormal results are displayed) Labs Reviewed  PROTIME-INR - Abnormal; Notable for the following components:      Result Value   Prothrombin Time 15.7 (*)    INR 1.3 (*)    All other components within normal limits  CBC - Abnormal; Notable for the following components:   WBC 11.3 (*)    RDW 15.9 (*)    nRBC 0.6 (*)    All other components within normal limits  DIFFERENTIAL - Abnormal; Notable for the following components:   Neutro Abs 8.0 (*)    Abs Immature Granulocytes 0.09 (*)    All other components within normal limits  COMPREHENSIVE METABOLIC PANEL - Abnormal; Notable for the following components:   CO2 19 (*)    Glucose, Bld 351 (*)    Creatinine, Ser 1.61 (*)    Calcium 8.7 (*)    Albumin  2.9 (*)    GFR calc non Af Amer 39 (*)    GFR calc Af Amer 45 (*)    All other components within normal limits  HEMOGLOBIN A1C - Abnormal; Notable for the following components:   Hgb A1c MFr Bld 9.5 (*)    All other components within normal limits  T4, FREE - Abnormal; Notable for the following components:   Free T4 1.60 (*)    All other components within normal limits  I-STAT CHEM 8, ED - Abnormal; Notable for the following components:   Creatinine, Ser 1.40 (*)    Glucose, Bld 341 (*)    Calcium, Ion 1.07 (*)    TCO2 21 (*)    All other components within normal limits  CBG MONITORING, ED - Abnormal; Notable for the following components:   Glucose-Capillary 327 (*)    All other components within normal limits  TROPONIN I (HIGH  SENSITIVITY) - Abnormal; Notable for the following components:   Troponin I (High Sensitivity) 204 (*)    All other components within normal limits  TROPONIN I (HIGH SENSITIVITY) - Abnormal; Notable for the following components:   Troponin I (High Sensitivity) 591 (*)    All other components within normal limits  RESPIRATORY PANEL BY RT PCR (FLU A&B, COVID)  MRSA PCR SCREENING  APTT  TSH  T3, FREE  BRAIN NATRIURETIC PEPTIDE    EMS rhythm strip with wide-complex tachycardia, regular, rate 150, a flutter versus sinus tach with conduction delay, abnormal   EKG EKG Interpretation  Date/Time:  Saturday August 15 2019 11:59:13 EST Ventricular Rate:  99 PR Interval:    QRS Duration: 157 QT Interval:  422 QTC Calculation: 542 R Axis:   75 Text Interpretation: Sinus rhythm Left bundle branch block Artifact Abnormal ECG Confirmed by Gerhard MunchLockwood, Janelly Switalski [phone]([id number]) on 08/15/2019 12:00:52 PM   Radiology CT ANGIO HEAD W OR WO CONTRAST  Result Date: 08/15/2019 CLINICAL DATA:  Stroke EXAM: CT ANGIOGRAPHY HEAD AND NECK TECHNIQUE: Multidetector CT imaging of the head and neck was performed using the standard protocol during bolus administration of intravenous contrast. Multiplanar CT image reconstructions and MIPs were obtained to evaluate the vascular anatomy. Carotid stenosis measurements (when applicable) are obtained utilizing NASCET criteria, using the distal internal carotid diameter as the denominator. CONTRAST:  75mL OMNIPAQUE IOHEXOL 350 MG/ML SOLN 75 mL Omnipaque 350 IV COMPARISON:  CT head 08/15/2019 FINDINGS: CTA NECK FINDINGS Aortic arch: Extensive atherosclerotic calcification aortic arch without dissection or aneurysm. Mild stenosis proximal left subclavian artery due to calcific stenosis. Right carotid system: Mild atherosclerotic calcification right carotid bifurcation without significant stenosis. Left carotid system: Left common carotid artery widely patent. Mild atherosclerotic disease  left carotid bifurcation without significant stenosis. There is irregularity and decreased caliber of the left internal carotid artery as it approaches the skull base. Moderate to severe stenosis at the skull base. Severe calcific stenosis of the left cavernous and supraclinoid internal carotid artery on the left. Vertebral arteries: Both vertebral arteries patent to the basilar. Moderate stenosis in the mid vertebral artery bilaterally due to atherosclerotic disease. Skeleton: Cervical spondylosis. Negative for fracture. Patchy sclerotic changes in the vertebra diffusely. Sclerotic changes in the mandible bilaterally. Other neck: Moderate to marked enlargement of the left thyroid. Due to early arterial phase scanning, evaluation for nodule is difficult. There does appear to be at least a 15 mm nodule in the left lobe. Left lobe extends into the superior mediastinum. Right lobe mildly enlarged. Upper chest: Acute pulmonary embolism.  Right pulmonary artery not scanned. Moderate pulmonary embolism left main pulmonary artery. Review of the MIP images confirms the above findings CTA HEAD FINDINGS Anterior circulation: Atherosclerotic calcification and moderate stenosis right internal carotid artery in the cavernous and supraclinoid segment. Right middle cerebral artery widely patent. Right anterior cerebral artery widely patent Small caliber left cavernous carotid which is diffusely diseased. Severe calcific stenosis terminal left internal carotid artery. Acute occlusion left M1 segment. Poor collateral circulation with no filling of left MCA branches. Left anterior cerebral artery patent. Posterior circulation: Both vertebral arteries patent to the basilar. Mild stenosis distal vertebral artery bilaterally. PICA patent bilaterally. Basilar widely patent. Posterior cerebral arteries patent bilaterally without stenosis. Venous sinuses: Limited venous contrast due to arterial phase scanning Anatomic variants: None Review  of the MIP images confirms the above findings IMPRESSION: 1. Acute occlusion left MCA with poor MCA collateral circulation. In addition, there is diffuse disease of the left internal carotid artery at the skull base and left cavernous segment which may be due to atherosclerotic disease or thrombus. Severe calcific stenosis left cavernous and supraclinoid internal carotid artery. Mild stenosis left internal carotid artery proximally. 2. Mild atherosclerotic disease right carotid bifurcation. Moderate calcific stenosis right cavernous and supraclinoid internal carotid artery. Right middle and right anterior cerebral arteries widely patent. 3. Posterior circulation widely patent 4. Acute pulmonary embolism. Moderate amount of clot in left pulmonary artery. Right pulmonary artery not well evaluated due to patient positioning during the scan. 5. Bilateral thyroid enlargement left greater than right. This may be due to goiter versus neoplasm. Thyroid ultrasound recommended for further evaluation 6. These results were called by telephone at the time of interpretation on 08/15/2019 at 11:52 Pm to provider Amada Jupiter MD, who verbally acknowledged these results. 7. Sclerotic changes throughout the spine which could be degenerative however metastatic disease is possible. Sclerotic changes in the mandible bilaterally. This may be due to chronic osteitis from dental disease. The patient is edentulous. Correlate with PSA. Electronically Signed   By: Marlan Palau M.D.   On: 08/15/2019 12:22   CT ANGIO NECK W OR WO CONTRAST  Result Date: 08/15/2019 CLINICAL DATA:  Stroke EXAM: CT ANGIOGRAPHY HEAD AND NECK TECHNIQUE: Multidetector CT imaging of the head and neck was performed using the standard protocol during bolus administration of intravenous contrast. Multiplanar CT image reconstructions and MIPs were obtained to evaluate the vascular anatomy. Carotid stenosis measurements (when applicable) are obtained utilizing NASCET  criteria, using the distal internal carotid diameter as the denominator. CONTRAST:  75mL OMNIPAQUE IOHEXOL 350 MG/ML SOLN 75 mL Omnipaque 350 IV COMPARISON:  CT head 08/15/2019 FINDINGS: CTA NECK FINDINGS Aortic arch: Extensive atherosclerotic calcification aortic arch without dissection or aneurysm. Mild stenosis proximal left subclavian artery due to calcific stenosis. Right carotid system: Mild atherosclerotic calcification right carotid bifurcation without significant stenosis. Left carotid system: Left common carotid artery widely patent. Mild atherosclerotic disease left carotid bifurcation without significant stenosis. There is irregularity and decreased caliber of the left internal carotid artery as it approaches the skull base. Moderate to severe stenosis at the skull base. Severe calcific stenosis of the left cavernous and supraclinoid internal carotid artery on the left. Vertebral arteries: Both vertebral arteries patent to the basilar. Moderate stenosis in the mid vertebral artery bilaterally due to atherosclerotic disease. Skeleton: Cervical spondylosis. Negative for fracture. Patchy sclerotic changes in the vertebra diffusely. Sclerotic changes in the mandible bilaterally. Other neck: Moderate to marked enlargement of the left thyroid. Due to early arterial phase  scanning, evaluation for nodule is difficult. There does appear to be at least a 15 mm nodule in the left lobe. Left lobe extends into the superior mediastinum. Right lobe mildly enlarged. Upper chest: Acute pulmonary embolism. Right pulmonary artery not scanned. Moderate pulmonary embolism left main pulmonary artery. Review of the MIP images confirms the above findings CTA HEAD FINDINGS Anterior circulation: Atherosclerotic calcification and moderate stenosis right internal carotid artery in the cavernous and supraclinoid segment. Right middle cerebral artery widely patent. Right anterior cerebral artery widely patent Small caliber left  cavernous carotid which is diffusely diseased. Severe calcific stenosis terminal left internal carotid artery. Acute occlusion left M1 segment. Poor collateral circulation with no filling of left MCA branches. Left anterior cerebral artery patent. Posterior circulation: Both vertebral arteries patent to the basilar. Mild stenosis distal vertebral artery bilaterally. PICA patent bilaterally. Basilar widely patent. Posterior cerebral arteries patent bilaterally without stenosis. Venous sinuses: Limited venous contrast due to arterial phase scanning Anatomic variants: None Review of the MIP images confirms the above findings IMPRESSION: 1. Acute occlusion left MCA with poor MCA collateral circulation. In addition, there is diffuse disease of the left internal carotid artery at the skull base and left cavernous segment which may be due to atherosclerotic disease or thrombus. Severe calcific stenosis left cavernous and supraclinoid internal carotid artery. Mild stenosis left internal carotid artery proximally. 2. Mild atherosclerotic disease right carotid bifurcation. Moderate calcific stenosis right cavernous and supraclinoid internal carotid artery. Right middle and right anterior cerebral arteries widely patent. 3. Posterior circulation widely patent 4. Acute pulmonary embolism. Moderate amount of clot in left pulmonary artery. Right pulmonary artery not well evaluated due to patient positioning during the scan. 5. Bilateral thyroid enlargement left greater than right. This may be due to goiter versus neoplasm. Thyroid ultrasound recommended for further evaluation 6. These results were called by telephone at the time of interpretation on 08/15/2019 at 11:52 Pm to provider Amada JupiterKirkpatrick MD, who verbally acknowledged these results. 7. Sclerotic changes throughout the spine which could be degenerative however metastatic disease is possible. Sclerotic changes in the mandible bilaterally. This may be due to chronic osteitis  from dental disease. The patient is edentulous. Correlate with PSA. Electronically Signed   By: Marlan Palauharles  Clark M.D.   On: 08/15/2019 12:09   ECHOCARDIOGRAM COMPLETE  Result Date: 08/15/2019   ECHOCARDIOGRAM REPORT   Patient Name:   Paul Arroyo Date of Exam: 08/15/2019 Medical Rec #:  [id number]     Height: Accession #:    [id number]    Weight:       212.1 lb Date of Birth:  06/28/1934    BSA:          2.10 m Patient Age:    85 years      BP:           187/104 mmHg Patient Gender: M             HR:           90 bpm. Exam Location:  Inpatient Procedure: 2D Echo Indications:    Pulmonary Embolism; Stroke I164.9  History:        Patient has no prior history of Echocardiogram examinations.                 CAD; Risk Factors:Hypertension and Diabetes.  Sonographer:    Thurman Coyerasey Kirkpatrick RDCS (AE) Referring Phys: [id number] GRACE E BOWSER IMPRESSIONS  1. Left ventricular ejection fraction, by visual estimation, is 55 to 60%. The  left ventricle has normal function. Left ventricular septal wall thickness was mildly increased. Mildly increased left ventricular posterior wall thickness. There is mildly increased left ventricular hypertrophy.  2. Elevated left ventricular end-diastolic pressure.  3. Left ventricular diastolic parameters are consistent with Grade II diastolic dysfunction (pseudonormalization).  4. The left ventricle has no regional wall motion abnormalities.  5. Intracavitary gradient noted. Peak velocity 1.76 m/s. Peak gradient 12.4 mmHg. Septal dyskinesis consistent with bundle branch block.  6. Global right ventricle has mildly reduced systolic function.The right ventricular size is normal. No increase in right ventricular wall thickness.  7. Left atrial size was moderately dilated.  8. Right atrial size was normal.  9. The mitral valve is normal in structure. No evidence of mitral valve regurgitation. No evidence of mitral stenosis. 10. The tricuspid valve is normal in structure. 11. The tricuspid valve is  normal in structure. Tricuspid valve regurgitation is mild. 12. Aortic valve mean gradient measures 14.0 mmHg. 13. Aortic valve peak gradient measures 28.1 mmHg. 14. The aortic valve is normal in structure. Aortic valve regurgitation is mild. Mild aortic valve stenosis. 15. The pulmonic valve was normal in structure. Pulmonic valve regurgitation is not visualized. 16. Moderately elevated pulmonary artery systolic pressure. 17. The inferior vena cava is dilated in size with <50% respiratory variability, suggesting right atrial pressure of 15 mmHg. FINDINGS  Left Ventricle: Left ventricular ejection fraction, by visual estimation, is 55 to 60%. The left ventricle has normal function. The left ventricle has no regional wall motion abnormalities. Mildly increased left ventricular posterior wall thickness. There is mildly increased left ventricular hypertrophy. Left ventricular diastolic parameters are consistent with Grade II diastolic dysfunction (pseudonormalization). Elevated left ventricular end-diastolic pressure. Intracavitary gradient noted. Peak velocity 1.76 m/s. Peak gradient 12.4 mmHg. Septal dyskinesis consistent with bundle branch block. Right Ventricle: The right ventricular size is normal. No increase in right ventricular wall thickness. Global RV systolic function is has mildly reduced systolic function. The tricuspid regurgitant velocity is 2.53 m/s, and with an assumed right atrial pressure of 15 mmHg, the estimated right ventricular systolic pressure is moderately elevated at 40.6 mmHg. Left Atrium: Left atrial size was moderately dilated. Right Atrium: Right atrial size was normal in size Pericardium: There is no evidence of pericardial effusion. Mitral Valve: The mitral valve is normal in structure. No evidence of mitral valve regurgitation. No evidence of mitral valve stenosis by observation. Tricuspid Valve: The tricuspid valve is normal in structure. Tricuspid valve regurgitation is mild. Aortic  Valve: The aortic valve is normal in structure.. There is moderate thickening and moderate calcification of the aortic valve. Aortic valve regurgitation is mild. Mild aortic stenosis is present. There is moderate thickening of the aortic valve. There is moderate calcification of the aortic valve. Aortic valve mean gradient measures 14.0 mmHg. Aortic valve peak gradient measures 28.1 mmHg. Aortic valve area, by VTI measures 0.64 cm. Pulmonic Valve: The pulmonic valve was normal in structure. Pulmonic valve regurgitation is not visualized. Pulmonic regurgitation is not visualized. Aorta: The aortic root, ascending aorta and aortic arch are all structurally normal, with no evidence of dilitation or obstruction. Venous: The inferior vena cava is dilated in size with less than 50% respiratory variability, suggesting right atrial pressure of 15 mmHg. IAS/Shunts: No atrial level shunt detected by color flow Doppler. There is no evidence of a patent foramen ovale. No ventricular septal defect is seen or detected. There is no evidence of an atrial septal defect.  LEFT VENTRICLE PLAX 2D LVIDd:  4.10 cm  Diastology LVIDs:         3.00 cm  LV e' lateral:   6.13 cm/s LV PW:         1.10 cm  LV E/e' lateral: 14.7 LV IVS:        1.10 cm  LV e' medial:    5.34 cm/s LVOT diam:     1.50 cm  LV E/e' medial:  16.9 LV SV:         39 ml LVOT Area:     1.77 cm  RIGHT VENTRICLE RV S prime:     4.88 cm/s TAPSE (M-mode): 1.1 cm LEFT ATRIUM             Index       RIGHT ATRIUM           Index LA diam:        4.20 cm 2.00 cm/m  RA Area:     21.50 cm LA Vol (A2C):   57.1 ml 27.19 ml/m RA Volume:   68.80 ml  32.76 ml/m LA Vol (A4C):   27.7 ml 13.19 ml/m LA Biplane Vol: 39.4 ml 18.76 ml/m  AORTIC VALVE AV Area (Vmax):    0.66 cm AV Area (Vmean):   0.67 cm AV Area (VTI):     0.64 cm AV Vmax:           265.00 cm/s AV Vmean:          169.500 cm/s AV VTI:            0.494 m AV Peak Grad:      28.1 mmHg AV Mean Grad:      14.0 mmHg  LVOT Vmax:         98.30 cm/s LVOT Vmean:        64.100 cm/s LVOT VTI:          0.179 m LVOT/AV VTI ratio: 0.36  AORTA Ao Root diam: 2.60 cm MITRAL VALVE                        TRICUSPID VALVE MV Area (PHT): 4.41 cm             TR Peak grad:   25.6 mmHg MV PHT:        49.88 msec           TR Vmax:        253.00 cm/s MV Decel Time: 172 msec MV E velocity: 90.30 cm/s 103 cm/s  SHUNTS MV A velocity: 54.20 cm/s 70.3 cm/s Systemic VTI:  0.18 m MV E/A ratio:  1.67       1.5       Systemic Diam: 1.50 cm  Chilton Si MD Electronically signed by Chilton Si MD Signature Date/Time: 08/15/2019/3:04:19 PM    Final    CT HEAD CODE STROKE WO CONTRAST  Result Date: 08/15/2019 CLINICAL DATA:  Code stroke.  Acute neuro deficit.  History stroke EXAM: CT HEAD WITHOUT CONTRAST TECHNIQUE: Contiguous axial images were obtained from the base of the skull through the vertex without intravenous contrast. COMPARISON:  None. FINDINGS: Brain: Hypodensity right parietal lobe consistent with chronic infarct. Chronic infarcts in the cerebellum bilaterally left greater than right. Chronic infarct left lateral pons. Generalized atrophy. No acute infarct. Negative for acute hemorrhage or mass. Vascular: Hyperdense left MCA consistent with acute occlusion and thrombus. Atherosclerotic calcification in the carotid arteries bilaterally. Calcification extends into the right M1 segment. Skull: Negative Sinuses/Orbits:  Mild mucosal edema paranasal sinuses. Other: None ASPECTS (Alberta Stroke Program Early CT Score) - Ganglionic level infarction (caudate, lentiform nuclei, internal capsule, insula, M1-M3 cortex): 7 - Supraganglionic infarction (M4-M6 cortex): 3 Total score (0-10 with 10 being normal): 10 IMPRESSION: 1. Negative for acute infarct or hemorrhage 2. ASPECTS is 10 3. Chronic infarcts in the right parietal lobe and in cerebellum bilaterally. 4. Hyperdense left MCA compatible with acute thrombus. 5. These results were called by  telephone at the time of interpretation on 08/15/2019 at 11:30 am to provider Amada Jupiter MD, who verbally acknowledged these results. Electronically Signed   By: Marlan Palau M.D.   On: 08/15/2019 11:32    Procedures Procedures (including critical care time)  CRITICAL CARE Performed by: Gerhard Munch Total critical care time: 35 minutes Critical care time was exclusive of separately billable procedures and treating other patients. Critical care was necessary to treat or prevent imminent or life-threatening deterioration. Critical care was time spent personally by me on the following activities: development of treatment plan with patient and/or surrogate as well as nursing, discussions with consultants, evaluation of patient's response to treatment, examination of patient, obtaining history from patient or surrogate, ordering and performing treatments and interventions, ordering and review of laboratory studies, ordering and review of radiographic studies, pulse oximetry and re-evaluation of patient's condition.   Medications Ordered in ED Medications  sodium chloride flush (NS) 0.9 % injection 3 mL (has no administration in time range)  clevidipine (CLEVIPREX) 0.5 MG/ML infusion (has no administration in time range)  alteplase (ACTIVASE) 1 mg/mL infusion 86.6 mg (has no administration in time range)    Followed by  0.9 %  sodium chloride infusion (has no administration in time range)  iohexol (OMNIPAQUE) 350 MG/ML injection 75 mL (75 mLs Intravenous Contrast Given 08/15/19 1158)    ED Course  I have reviewed the triage vital signs and the nursing notes.  Pertinent labs & imaging results that were available during my care of the patient were reviewed by me and considered in my medical decision making (see chart for details).  Patient evaluated initially with our neurology colleagues as a code stroke. Subsequently was taken expeditiously to the CT scanner. Reviewing the images live,  the patient does not demonstrating evidence for hemorrhage.  CT angiography proceeding.  11:57 AM On reviewing CT images with our neurology colleague, patient found to have left-sided large pulmonary embolism.  Patient is preparing for TPA given endocranial occlusion as well.  Blood pressure 160/70.    On cardiac monitor the patient has interventricular conduction delay, rate 99 sinus, artifact, abnormal  On repeat exam patient is in similar condition. I discussed patient's case with our critical care colleagues will follow as a consulting team, the patient will been into the neurologic ICU by our neurology team.    MDM Rules/Calculators/A&P                     Elderly male with multiple medical issues presents with new aphasia, worsening general status, is found to have acute stroke as well as acute pulmonary embolism. Given his substantial findings, patient was started on TPA, required admission to the ICU. Multiple conversations held by colleagues with his daughter regarding goals of care, patient was designated DNR.  Final Clinical Impression(s) / ED Diagnoses Final diagnoses:  Acute ischemic left MCA stroke (HCC)  Acute pulmonary embolism, unspecified pulmonary embolism type, unspecified whether acute cor pulmonale present [facility])     Gerhard Munch, MD 08/15/19  1522  

## 2019-08-15 NOTE — ED Notes (Signed)
Pt CBG was 327, notified Hope(RN)

## 2019-08-15 NOTE — ED Triage Notes (Signed)
Pt arrives via EMS from home. LSN 9am. Pt's dtr states he was acting normal prior to 9am, other than stating he was not hungry. At 9am, he became mute, right sided facial droop, left sided gaze. Pt has baseline of stroke that affected right side- pt is wheelchair bound. BP 152/105, HR 115, CBG 117

## 2019-08-15 NOTE — Code Documentation (Signed)
Patient arrived via EMS at 1113  He was last known well at 0900 this am when he had a conversation with his Daughter.  Code Stroke called at 1100.  History of CVA affecting his right side (no movement of right arm and leg, wheelchair bound, able to stand and pivot)  He also has difficulty swallowing. Moved to [place] to live with his daughter about a year ago from [place] state.   This morning he suddenly became mute and had a left gaze preference.  NIHSS 28  Dr Amada Jupiter assessed patient upon arrival.  Stat Head Ct and labs done.  CTA done. BP 175/136  HR 120s  RR 20-30s  O2 sat 88% on RA  Placed on [place]  O2 sats 93% 10 mg Labetalol given IV cleviprex gtt started for BP control,  Titrated up to 8 mg/hr TPA started at 1153 Increased Cleviprex at 1300 to 16mg /hr.  After TPA started patient able to make some sounds and say some words.  He still does not follow commands.  Increased O2 to 6L [place] sats 90%  Dr spoke with daughter at bedside. Pt DNR Plan admit 4N ICU

## 2019-08-15 NOTE — Progress Notes (Signed)
  Echocardiogram 2D Echocardiogram has been performed.  Tye Savoy 08/15/2019, 2:55 PM

## 2019-08-15 NOTE — Progress Notes (Signed)
eLink Physician-Brief Progress Note Patient Name: Paul Arroyo DOB: 01/03/1934 MRN: [id number]   Date of Service  08/15/2019  HPI/Events of Note  Hyperglycemia - Blood glucose = 327, 354 and 379. Currently on Q 4 hour moderate Novolog SSI.  eICU Interventions  Will order: 1. Levemir 10 units Exline Q12 hours.      Intervention Category Major Interventions: Hyperglycemia - active titration of insulin therapy  Lenell Antu 08/15/2019, 8:52 PM

## 2019-08-15 NOTE — Consult Note (Signed)
NAME:  Paul Arroyo, MRN:  [id number], DOB:  August 07, 1933, LOS: 0 ADMISSION DATE:  08/15/2019, CONSULTATION DATE:  08/15/19 REFERRING MD: Amada Jupiter - neuro , CHIEF COMPLAINT:  AMS, CVA  Brief History   86 yo M with acute L MCA CVA for which he has received tPA, found to also have acute Pulmonary Embolism   History of present illness   86 yo M PMH as reported per daughter as prior CVA (of note, as result of a prior CVA, patient is reportedly unable to move his R side), CAD, HTN, DM and "blood clots" for which the patient takes coumadin, who presents to [facility] ED 1/23 as code stroke with R facial droop, L sided weakness, aphasia.  LKN 2.5 hours prior to presentation. In ED, CT H reveals L MCA CVA. Received tPA after initial hypertensive urgency corrected. CTAngio heat with acute L MCA occlusion and poor MCA collateral, CTAngio neck reveals acute pulmonary embolism.   Baseline functional status: unable to move R side. Unable to perform ADLs, wheelchair bound, difficulty swallowing. Patient has recently moved to [facility] from [place] and medical records are being obtained for more complete medical history.    In setting of CVA s/p tPA and PE, PCCM asked to consult. Past Medical History  CVA HTN DM Blood Clots CAD  Significant Hospital Events   1/23 [facility] as code stroke, found to have L MCA CVA. Received tPA. Has acute PE. PCCm consulted.  Consults:  PCCM  Procedures:  1/23 tPA for acute L MCA CVA   Significant Diagnostic Tests:  1/23 CT H> Hyperdense L MCA, consistent with acute occlusion and thrombus.  1/23 CT angio head> acute occlusino of L MCA with poor MCA collateral circulation. Diffuse L ICA disease at skull base and L cavernous segment-- atherosclerotic vs thrombus. Severe calcific stenosis L cavernous and supraclinoid internal carotid artery. Mild proximal L ICA stenosis. Patent posterior circulation. 1/23 CT angio neck> acute pulmonary embolism. Moderate amount of clot seen in L pulmonary  artery. R pulmonary artery not well visualized due to patient position. Bilateral thyroid enlargement, goiter vs neoplasm.   Micro Data:  1/23 SARS Cov2> neg 1/23 FluA/B> neg   Antimicrobials:    Interim history/subjective:  Thoughtful conversation between neurology and family, in which patient has been made DNR.   Objective   Blood pressure (!) 178/107, pulse 88, temperature 97.8 F (36.6 C), temperature source Oral, resp. rate (!) 29, weight 96.2 kg, SpO2 91 %.       No intake or output data in the 24 hours ending 08/15/19 1257 Filed Weights   08/15/19 1100 08/15/19 1159  Weight: 96.2 kg 96.2 kg    Examination: General: Chronically ill appearing elderly male, NAD HENT: [place]/AT, PERRL, EOM-I and MMM Lungs: CTA bilaterally on 5L Cardiovascular: IRIR, Nl S1/S2 and -M/R/G Abdomen: Soft, NT, ND and +BS Extremities: 1+ edema bilaterally Neuro: Alert and oriented, aphasic and does not follow command Skin: Intact  I reviewed head CT myself, CVA noted  Discussed with PCCM-NP  Resolved Hospital Problem list     Assessment & Plan:   Acute L MCA CVA, s/p tPA  -received tPA 1/23 in ED -sx include R facial droop L sided weakness, aphasia  P -Neuro is primary -Follow  Up imaging per neuro  - BP goal < 180/105, cardene for goal  Acute Pulmonary Embolism -notably, tPA given for CVA P -follow up ECHO    Prior CVA with residual R sided hemiparesis, dysphagia P -NPO, aspiration precautions -  PT/OT   Renal Insufficiency, unknown chronicity -presenting Cr 1.61 GFR < 60. Suspect component of CKD -expect predictable Cr bump following CT angios  P -trend renal indices -IVF (LR at 62ml/hr)  Hx of prior blood clots -reportedly on coumadin however unknown compliance P -check coags  -consider BLE venous dopplers to evaluate for further DVT   HTN CAD P -presently on cardene gtt with BP goal per neuro -will check BNP   DM with acute hyperglycemia P -SSI  Enlarged  thyroid gland -seen on CT angio neck. Goiter vs neoplasm P -check TSH T3 T4 -when clinically appropriate consider thyroid US     Goals of Care: -unfortunate case in which an 86 yo M with prior CVA resulting in R hemiparesis has sustained significant acute L MCA CVA. -At baseline, patient is wheelchair bound, with R hemiparesis but able to stand and pivot, unable to complete ADLs and with difficulty swallowing/dysphagia.  -He was recently moved from [place] to [place] to be with family. -With this acute CVA, his code status has been changed to DNR. Pending clinical course in the near future, further clarification regarding goals of care for Mr. Paul Arroyo may be prudent.    Best practice:  Diet: NPO + aspiration precautions  Pain/Anxiety/Delirium protocol (if indicated): na VAP protocol (if indicated): na DVT prophylaxis: SCD, s/p tPA for acute CVA GI prophylaxis: protonix Glucose control: monitor Mobility: BR  Code Status: DNR Family Communication: Per primary Disposition: Admit to ICU with Neurology as primary   Labs   CBC: Recent Labs  Lab 08/15/19 1115 08/15/19 1121  WBC 11.3*  --   NEUTROABS 8.0*  --   HGB 14.1 14.6  HCT 42.6 43.0  MCV 92.8  --   PLT 229  --     Basic Metabolic Panel: Recent Labs  Lab 08/15/19 1115 08/15/19 1121  NA 139 142  K 3.9 3.8  CL 107 108  CO2 19*  --   GLUCOSE 351* 341*  BUN 21 22  CREATININE 1.61* 1.40*  CALCIUM 8.7*  --    GFR: CrCl cannot be calculated (Unknown ideal weight.). Recent Labs  Lab 08/15/19 1115  WBC 11.3*    Liver Function Tests: Recent Labs  Lab 08/15/19 1115  AST 18  ALT 13  ALKPHOS 71  BILITOT 1.1  PROT 7.0  ALBUMIN 2.9*   No results for input(s): LIPASE, AMYLASE in the last 168 hours. No results for input(s): AMMONIA in the last 168 hours.  ABG    Component Value Date/Time   TCO2 21 (L) 08/15/2019 1121     Coagulation Profile: Recent Labs  Lab 08/15/19 1115  INR 1.3*    Cardiac Enzymes: No  results for input(s): CKTOTAL, CKMB, CKMBINDEX, TROPONINI in the last 168 hours.  HbA1C: No results found for: HGBA1C  CBG: Recent Labs  Lab 08/15/19 1156  GLUCAP 327*    Review of Systems:   12 point ROS is unattainble due to aphasia and no family bedside at the time of my exam.  Past Medical History  He,  has a past medical history of Arthritis, Coronary artery disease, Diabetes mellitus without complication (HCC), Hypertension, and Stroke (HCC).   Surgical History    Past Surgical History:  Procedure Laterality Date  . right knee replacement Right 2016     Social History   reports that he has never smoked. He has never used smokeless tobacco. He reports previous alcohol use. He reports that he does not use drugs.  Family History   His family history includes CVA in his mother.   Allergies No Known Allergies   Home Medications  Prior to Admission medications   Not on File    Attending Note:  86 year old elderly male presenting as a code stroke.  Patient was given tPA.  On exam, aphasic with clear lungs.  I reviewed CT of the neck and PE was noted.  Discussed with PCCM-NP.  Patient was given tPA with minimal to no resolution.  PCCM was consulted for PE management that was an incidental finding.  He is relatively asymptomatic from the PE with minimal O2 requirement and no hemodynamic effects.  Since patient received tPA and with a very large CVA neuro is concerned about heparin use in the first 24 hours.  Given above will not give heparin, can start after 24 hours.  Titrate O2 for sat of 88-92%.  Tele monitoring.  Order lower ext dopplers and would place a filter as I suspect there is a defect causing a DVT to cause the PE and CVA.  PCCM will continue to follow.  Per discussion between neurology and daughter, patient has had a very poor functional status and is DNR/DNI but full medical therapy.  The patient is critically ill with multiple organ systems failure and requires  high complexity decision making for assessment and support, frequent evaluation and titration of therapies, application of advanced monitoring technologies and extensive interpretation of multiple databases.   Critical Care Time devoted to patient care services described in this note is  34  Minutes. This time reflects time of care of this signee Dr Jennet Maduro. This critical care time does not reflect procedure time, or teaching time or supervisory time of PA/NP/Med student/Med Resident etc but could involve care discussion time.  Rush Farmer, M.D. [facility] Pulmonary/Critical Care Medicine.

## 2019-08-15 NOTE — ED Notes (Signed)
Pt had BM and urine- cleaned pt and linens.

## 2019-08-16 ENCOUNTER — Inpatient Hospital Stay (HOSPITAL_COMMUNITY): Payer: Medicare Other

## 2019-08-16 ENCOUNTER — Encounter (HOSPITAL_COMMUNITY): Payer: Self-pay

## 2019-08-16 DIAGNOSIS — I63412 Cerebral infarction due to embolism of left middle cerebral artery: Secondary | ICD-10-CM

## 2019-08-16 DIAGNOSIS — I63512 Cerebral infarction due to unspecified occlusion or stenosis of left middle cerebral artery: Principal | ICD-10-CM

## 2019-08-16 DIAGNOSIS — I6389 Other cerebral infarction: Secondary | ICD-10-CM

## 2019-08-16 DIAGNOSIS — Z8673 Personal history of transient ischemic attack (TIA), and cerebral infarction without residual deficits: Secondary | ICD-10-CM

## 2019-08-16 DIAGNOSIS — I2690 Septic pulmonary embolism without acute cor pulmonale: Secondary | ICD-10-CM

## 2019-08-16 DIAGNOSIS — G936 Cerebral edema: Secondary | ICD-10-CM

## 2019-08-16 DIAGNOSIS — Z515 Encounter for palliative care: Secondary | ICD-10-CM

## 2019-08-16 DIAGNOSIS — I161 Hypertensive emergency: Secondary | ICD-10-CM

## 2019-08-16 DIAGNOSIS — G935 Compression of brain: Secondary | ICD-10-CM

## 2019-08-16 DIAGNOSIS — I63012 Cerebral infarction due to thrombosis of left vertebral artery: Secondary | ICD-10-CM

## 2019-08-16 LAB — GLUCOSE, CAPILLARY
Glucose-Capillary: 274 mg/dL — ABNORMAL HIGH (ref 70–99)
Glucose-Capillary: 184 mg/dL — ABNORMAL HIGH (ref 70–99)

## 2019-08-16 LAB — CBC
HCT: 41.3 % (ref 39.0–52.0)
Hemoglobin: 13.6 g/dL (ref 13.0–17.0)
MCH: 30.3 pg (ref 26.0–34.0)
MCHC: 32.9 g/dL (ref 30.0–36.0)
MCV: 92 fL (ref 80.0–100.0)
Platelets: 188 10*3/uL (ref 150–400)
RBC: 4.49 MIL/uL (ref 4.22–5.81)
RDW: 15.9 % — ABNORMAL HIGH (ref 11.5–15.5)
WBC: 17.8 10*3/uL — ABNORMAL HIGH (ref 4.0–10.5)
nRBC: 0.4 % — ABNORMAL HIGH (ref 0.0–0.2)

## 2019-08-16 LAB — LIPID PANEL
Cholesterol: 154 mg/dL (ref 0–200)
HDL: 39 mg/dL — ABNORMAL LOW (ref >40)
LDL Cholesterol: 96 mg/dL (ref 0–99)
Total CHOL/HDL Ratio: 3.9 RATIO
Triglycerides: 96 mg/dL (ref <150)
VLDL: 19 mg/dL (ref 0–40)

## 2019-08-16 LAB — HEMOGLOBIN A1C
Hgb A1c MFr Bld: 9.5 % — ABNORMAL HIGH (ref 4.8–5.6)
Mean Plasma Glucose: 225.95 mg/dL

## 2019-08-16 LAB — T3, FREE: T3, Free: 1.9 pg/mL — ABNORMAL LOW (ref 2.0–4.4)

## 2019-08-16 MED ORDER — ONDANSETRON 4 MG PO TBDP: 4.0000 mg | ORAL_TABLET | Freq: Four times a day (QID) | ORAL | Status: DC | PRN

## 2019-08-16 MED ORDER — BIOTENE DRY MOUTH MT LIQD: 15.0000 mL | OROMUCOSAL | Status: DC | PRN

## 2019-08-16 MED ORDER — MORPHINE SULFATE (PF) 2 MG/ML IV SOLN: 1.0000 mg | INTRAVENOUS | Status: DC | PRN

## 2019-08-16 MED ORDER — SCOPOLAMINE 1 MG/3DAYS TD PT72
1.0000 | MEDICATED_PATCH | TRANSDERMAL | Status: DC
  Administered 2019-08-16: 1.5 mg via TRANSDERMAL
  Filled 2019-08-16: qty 1

## 2019-08-16 MED ORDER — POLYVINYL ALCOHOL 1.4 % OP SOLN
1.0000 [drp] | Freq: Four times a day (QID) | OPHTHALMIC | Status: DC | PRN
  Filled 2019-08-16: qty 15

## 2019-08-16 MED ORDER — GLYCOPYRROLATE 0.2 MG/ML IJ SOLN: 0.2000 mg | INTRAMUSCULAR | Status: DC | PRN

## 2019-08-16 MED ORDER — POLYVINYL ALCOHOL 1.4 % OP SOLN: 1.0000 [drp] | Freq: Four times a day (QID) | OPHTHALMIC | 0 refills | Status: AC | PRN

## 2019-08-16 MED ORDER — LORAZEPAM 1 MG PO TABS: 1.0000 mg | ORAL_TABLET | ORAL | Status: DC | PRN

## 2019-08-16 MED ORDER — MORPHINE SULFATE 20 MG/5ML PO SOLN: 5.0000 mg | ORAL | 0 refills | Status: AC | PRN

## 2019-08-16 MED ORDER — ONDANSETRON HCL 4 MG/2ML IJ SOLN: 4.0000 mg | Freq: Four times a day (QID) | INTRAMUSCULAR | Status: DC | PRN

## 2019-08-16 MED ORDER — GLYCOPYRROLATE 1 MG PO TABS: 1.0000 mg | ORAL_TABLET | ORAL | Status: DC | PRN

## 2019-08-16 MED ORDER — HALOPERIDOL 0.5 MG PO TABS: 0.5000 mg | ORAL_TABLET | ORAL | Status: DC | PRN

## 2019-08-16 MED ORDER — LORAZEPAM 2 MG/ML PO CONC: 1.0000 mg | ORAL | Status: DC | PRN

## 2019-08-16 MED ORDER — LORAZEPAM 2 MG/ML IJ SOLN: 1.0000 mg | INTRAMUSCULAR | Status: DC | PRN

## 2019-08-16 MED ORDER — LORAZEPAM 2 MG/ML PO CONC: 1.0000 mg | ORAL | 0 refills | Status: AC | PRN

## 2019-08-16 MED ORDER — HALOPERIDOL LACTATE 5 MG/ML IJ SOLN: 0.5000 mg | INTRAMUSCULAR | Status: DC | PRN

## 2019-08-16 MED ORDER — HALOPERIDOL LACTATE 2 MG/ML PO CONC: 0.5000 mg | ORAL | Status: DC | PRN

## 2019-08-16 MED ORDER — ONDANSETRON 4 MG PO TBDP: 4.0000 mg | ORAL_TABLET | Freq: Four times a day (QID) | ORAL | 0 refills | Status: AC | PRN

## 2019-08-24 NOTE — Progress Notes (Signed)
PT Cancellation Note  Patient Details Name: Paul Arroyo MRN: [id number] DOB: [birth year]   Cancelled Treatment:    Reason Eval/Treat Not Completed: Other (comment) note transition to comfort.  PT to sign off.    [name] 2019/08/21, 8:23 AM  Sheran Lawless, PT Acute Rehabilitation Services [phone] Aug 21, 2019

## 2019-08-24 NOTE — Progress Notes (Signed)
[facility])  Referral received for hospice services.  Plan is to d/c today ASAP per daughter, so pt can pass at home with family.  D/C address correct in Epic.  Will need ambulance transport home.  O2 and suction ordered, pt can transport home once it is delivered.  MD, please arrange for comfort scripts so there is no lapse in pts comfort prior to hospice services arriving.  Will update TOC manager once all is set up.  Wallis Bamberg RN, BSN, CCRN [facility] Liaison  [phone]

## 2019-08-24 NOTE — Progress Notes (Signed)
OT Cancellation Note  Patient Details Name: Paul Arroyo MRN: [id number] DOB: 1934-05-14   Cancelled Treatment:    Reason Eval/Treat Not Completed: Medical issues which prohibited therapy.  Pt transitioning to comfort.  OT will sign off.  Eber Jones., OTR/L Acute Rehabilitation Services Pager [phone] Office [phone]   Paul Arroyo M 2019-09-08, 6:24 AM

## 2019-08-24 NOTE — Progress Notes (Signed)
The chaplain visited as a result of rounding.  The chaplain offered prayer and comfort for the patient and family.  The chaplain will consult the next on-call chaplain concerning this patient for follow-up.  Lavone Neri Chaplain Resident For questions concerning this note please contact me by pager [phone]

## 2019-08-24 NOTE — Progress Notes (Signed)
Neuro paged due to patient's change in status, STAT CT ordered.

## 2019-08-24 NOTE — Progress Notes (Addendum)
NAME:  Paul Arroyo, MRN:  [id number], DOB:  [birth year], LOS: 1 ADMISSION DATE:  08/15/2019, CONSULTATION DATE:  08/15/2019 REFERRING MD: Leonel Ramsay, CHIEF COMPLAINT: AMS, CVA  Brief History   86 yo M with acute L MCA CVA for which he has received tPA, found to also have acute Pulmonary Embolism   History of present illness   86 yo M PMH as reported per daughter as prior CVA (of note, as result of a prior CVA, patient is reportedly unable to move his R side), CAD, HTN, DM and "blood clots" for which the patient takes coumadin, who presents to [facility] ED 1/23 as code stroke with R facial droop, L sided weakness, aphasia.  LKN 2.5 hours prior to presentation. In ED, CT H reveals L MCA CVA. Received tPA after initial hypertensive urgency corrected. CTAngio heat with acute L MCA occlusion and poor MCA collateral, CTAngio neck reveals acute pulmonary embolism.   Baseline functional status: unable to move R side. Unable to perform ADLs, wheelchair bound, difficulty swallowing. Patient has recently moved to [facility] from [place] and medical records are being obtained for more complete medical history.    In setting of CVA s/p tPA and PE, PCCM asked to consult.  Past Medical History   Past Medical History:  Diagnosis Date  . Arthritis   . Coronary artery disease   . Diabetes mellitus without complication ([place])   . Hypertension   . Stroke [facility])    Significant Hospital Events   1/23 [facility] as code stroke, found to have L MCA CVA. Received tPA. Has acute PE. PCCm consulted.  Consults:  pccm  Procedures:  1/23 TPA for acute left MCA CVA  Significant Diagnostic Tests:  1/23 CT H> Hyperdense L MCA, consistent with acute occlusion and thrombus.  1/23 CT angio head> acute occlusino of L MCA with poor MCA collateral circulation. Diffuse L ICA disease at skull base and L cavernous segment-- atherosclerotic vs thrombus. Severe calcific stenosis L cavernous and supraclinoid internal carotid artery. Mild proximal L  ICA stenosis. Patent posterior circulation. 1/23 CT angio neck> acute pulmonary embolism. Moderate amount of clot seen in L pulmonary artery. R pulmonary artery not well visualized due to patient position. Bilateral thyroid enlargement, goiter vs neoplasm.   Micro Data:  1/23 SARS Cov2> neg 1/23 FluA/B> neg    Antimicrobials:  None  Interim history/subjective:  No overnight events Patient appears comfortable He is DNR  Objective   Blood pressure (!) 159/69, pulse 74, temperature 97.8 F (36.6 C), temperature source Oral, resp. rate 20, weight 96.2 kg, SpO2 99 %.    FiO2 (%):  [44 %] 44 %   Intake/Output Summary (Last 24 hours) at Aug 25, 2019 0919 Last data filed at 08-25-2019 0800 Gross per 24 hour  Intake 1620.84 ml  Output 475 ml  Net 1145.84 ml   Filed Weights   08/15/19 1100 08/15/19 1159  Weight: 96.2 kg 96.2 kg   Examination: General: Chronically ill-appearing, elderly gentleman HENT: Moist oral mucosa Lungs: Clear breath sounds bilaterally Cardiovascular: S1-S2 appreciated Abdomen: Soft, bowel sounds appreciated Extremities: 1+ edema Neuro: Not arousable GU:   Echocardiogram noted  Resolved Hospital Problem list     Assessment & Plan:   Acute left MCA CVA, status post TPA -Received TPA 1/23 in ED -Right facial droop, left-sided weakness, aphasia -Neurology continues to follow -Follow-up neurology for new -BP goal less than 180-on Cardene  Acute pulmonary embolism -We will follow-up on echocardiogram -Patient did receive systemic TPA for CVA  Prior CVA with residual right-sided hemiparesis, dysphagia -N.p.o. -Aspiration precautions  Renal insufficiency -Trend renal indices -Continue IV fluids -Avoid nephrotoxic's  Hypertension Coronary artery disease -On Cardene  Diabetes -SSI  Enlarged thyroid gland on CT -We will follow  Goals of care -Large left MCA -Post TPA -Hemorrhagic conversion of CVA with herniation -At baseline,  wheelchair-bound with right hemiparesis but able to stand and pivot -Appears to have suffered a massive CVA which appears nonsurvivable Best practice will be comfort measures only   Discussed with neurology Plan will be care at home-discharge planning  CT of the head images were reviewed with the daughter at bedside   Best practice:  Diet: N.p.o. Pain/Anxiety/Delirium protocol (if indicated): As needed VAP protocol (if indicated): Not indicated Code Status: DNR Family Communication: Spoke with daughter at bedside, another daughter was on the phone Disposition: ICU  Labs   CBC: Recent Labs  Lab 08/15/19 1115 08/15/19 1121 08/15/19 1845 August 29, 2019 0302  WBC 11.3*  --   --  17.8*  NEUTROABS 8.0*  --   --   --   HGB 14.1 14.6 14.4 13.6  HCT 42.6 43.0 42.4 41.3  MCV 92.8  --   --  92.0  PLT 229  --   --  188    Basic Metabolic Panel: Recent Labs  Lab 08/15/19 1115 08/15/19 1121  NA 139 142  K 3.9 3.8  CL 107 108  CO2 19*  --   GLUCOSE 351* 341*  BUN 21 22  CREATININE 1.61* 1.40*  CALCIUM 8.7*  --    GFR: CrCl cannot be calculated (Unknown ideal weight.). Recent Labs  Lab 08/15/19 1115 08/29/2019 0302  WBC 11.3* 17.8*    Liver Function Tests: Recent Labs  Lab 08/15/19 1115  AST 18  ALT 13  ALKPHOS 71  BILITOT 1.1  PROT 7.0  ALBUMIN 2.9*   No results for input(s): LIPASE, AMYLASE in the last 168 hours. No results for input(s): AMMONIA in the last 168 hours.  ABG    Component Value Date/Time   TCO2 21 (L) 08/15/2019 1121     Coagulation Profile: Recent Labs  Lab 08/15/19 1115  INR 1.3*    Cardiac Enzymes: No results for input(s): CKTOTAL, CKMB, CKMBINDEX, TROPONINI in the last 168 hours.  HbA1C: Hgb A1c MFr Bld  Date/Time Value Ref Range Status  August 29, 2019 03:02 AM 9.5 (H) 4.8 - 5.6 % Final    Comment:    (NOTE) Pre diabetes:          5.7%-6.4% Diabetes:              >6.4% Glycemic control for   <7.0% adults with diabetes     08/15/2019 02:02 PM 9.5 (H) 4.8 - 5.6 % Final    Comment:    (NOTE) Pre diabetes:          5.7%-6.4% Diabetes:              >6.4% Glycemic control for   <7.0% adults with diabetes     CBG: Recent Labs  Lab 08/15/19 1156 08/15/19 1542 08/15/19 1932 08/15/19 2312 08/29/2019 0333  GLUCAP 327* 354* 376* 274* 184*    Review of Systems:   Unable to obtain  Past Medical History  He,  has a past medical history of Arthritis, Coronary artery disease, Diabetes mellitus without complication (HCC), Hypertension, and Stroke (HCC).   Surgical History    Past Surgical History:  Procedure Laterality Date  . right knee replacement Right 2016  Social History   reports that he has never smoked. He has never used smokeless tobacco. He reports previous alcohol use. He reports that he does not use drugs.   Family History   His family history includes CVA in his mother.   Allergies Allergies  Allergen Reactions  . Tape Other (See Comments)    PATIENT'S SKIN IS DELICATE!!    The patient is critically ill with multiple organ systems failure and requires high complexity decision making for assessment and support, frequent evaluation and titration of therapies, application of advanced monitoring technologies and extensive interpretation of multiple databases. Critical Care Time devoted to patient care services described in this note independent of APP/resident time (if applicable)  is 40 minutes.   Virl Diamond MD [place] Pulmonary Critical Care Personal pager: [phone] If unanswered, please page CCM On-call: [phone]

## 2019-08-24 NOTE — TOC Transition Note (Signed)
Transition of Care [facility]) - CM/SW Discharge Note   Patient Details  Name: Paul Arroyo MRN: [id number] Date of Birth: [birth year]  Transition of [facility]) CM/SW Contact:  Deveron Furlong, RN 2019/09/13, 10:13 AM   Clinical Narrative:    Patient to dc home with daughter and home hospice.  Spoke with daughter, Paul Arroyo, regarding plan.  She would like for patient to come home ASAP.  They have "electric" bed and do not need hospital bed.  The patient will need oxygen delivered.  Angie does not have a preference of agency and would like to use the one that can provide the quickest discharge arrangements.   Referral called to Wallis Bamberg with Industrial/product designer [facility] [place]). Victorino Dike will contact daughter and arrange for d/c today.  Final next level of care: Home w Hospice Care Barriers to Discharge: No Barriers Identified   Patient Goals and CMS Choice   CMS Medicare.gov Compare Post Acute Care list provided to:: Patient Represenative (must comment)(daughter) Choice offered to / list presented to : Adult Children   Discharge Plan and Services                DME Arranged: Oxygen       Representative spoke with at DME Agency: per [facility]

## 2019-08-24 NOTE — Plan of Care (Signed)
Pt d/c to home by ambulance. Assessment stable at this time.

## 2019-08-24 NOTE — Progress Notes (Signed)
Speech Language Pathology Discharge Patient Details Name: Paul Arroyo MRN: [id number] DOB: 08-13-1933 Today's Date: 09/08/2019 Time:  -     Patient discharged from SLP services secondary to per RN patient is pending DC with hospice services.  If things change and speech therapy services are desired please feel free to re-consult.  Please see latest therapy progress note for current level of functioning and progress toward goals.       Dimas Aguas, MA, CCC-SLP Acute Rehab SLP [phone]  Fleet Contras 2019-09-08, 10:40 AM

## 2019-08-24 NOTE — Discharge Summary (Addendum)
Patient ID: Paul Arroyo    l   MRN: [id number]      DOB: 12/31/1933  Date of Admission: 08/15/2019 Date of Discharge: 06/01/20  Attending Physician:  Marvel PlanXu, Crystalyn Delia, MD, Stroke MD Consultant(s):   Critical Care Medicine - Alyson ReedyYacoub, Wesam G, MD Patient's PCP:  No primary care provider on file.  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 This may be due to goiter versus neoplasm. Thyroid ultrasound recommended for further evaluation    Past Medical History:  Diagnosis Date  . Arthritis   . Coronary artery disease   . Diabetes mellitus without complication (HCC)   . Hypertension   . Stroke [facility](HCC)    Past Surgical History:  Procedure Laterality Date  . right knee replacement Right 2016    Family History Family History  Problem Relation Age of Onset  . CVA Mother     Social History  reports that he has never smoked. He has never used smokeless tobacco. He reports previous alcohol use. He reports that he does not use drugs.  Allergies as of 06/01/20      Reactions   Tape Other (See Comments)   PATIENT'S SKIN IS DELICATE!!      Medication List    STOP taking these medications   amLODipine 10 MG tablet Commonly known as: NORVASC   atorvastatin 80 MG tablet Commonly known as: LIPITOR   Bayer Aspirin 325 MG EC tablet Generic drug: aspirin   gabapentin 100 MG capsule Commonly known as: NEURONTIN   glipiZIDE 2.5 MG 24 hr  tablet Commonly known as: GLUCOTROL XL   metFORMIN 1000 MG tablet Commonly known as: GLUCOPHAGE     TAKE these medications   Coban LF Self-Adherent Wrap Misc See admin instructions. Apply to wound on right lower leg once daily (after dressing change regimen)   LORazepam 2 MG/ML concentrated solution Commonly known as: ATIVAN Take 0.5 mLs (1 mg total) by mouth every 4 (four) hours as needed for anxiety.   morphine 20 MG/5ML solution Take 1.3 mLs (5.2 mg total) by mouth every 4 (four) hours as needed for up to 2 days for pain.   neomycin-bacitracin-polymyxin [phone] ointment Apply 1 application topically See admin instructions. Apply to right lower leg daily after cleansing   ondansetron 4 MG disintegrating tablet Commonly known as: ZOFRAN-ODT Take 1 tablet (4 mg total) by mouth every 6 (six) hours as needed for nausea.   polyvinyl alcohol 1.4 % ophthalmic solution Commonly known as: LIQUIFILM TEARS Place 1 drop into both eyes 4 (four) times daily as needed for dry eyes.   Tegaderm Misc Apply topically See admin instructions. Apply to right lower leg once daily after cleansing/drying (dressing change)   Wound/Skin Cleanser Liqd Apply topically See admin instructions. Clean wound on right lower leg daily (part of dressing change regimen)       HOME MEDICATIONS PRIOR TO ADMISSION Medications Prior to Admission  Medication Sig Dispense Refill  . amLODipine (NORVASC) 10 MG tablet  Take 10 mg by mouth daily.    Marland Kitchen aspirin (BAYER ASPIRIN) 325 MG EC tablet Take 325 mg by mouth daily.    Marland Kitchen atorvastatin (LIPITOR) 80 MG tablet Take 80 mg by mouth every evening.    . Elastic Bandages & Supports (COBAN LF SELF-ADHERENT WRAP) MISC See admin instructions. Apply to wound on right lower leg once daily (after dressing change regimen)    . gabapentin (NEURONTIN) 100 MG capsule Take 100 mg by mouth at bedtime.    Marland Kitchen glipiZIDE (GLUCOTROL XL) 2.5 MG 24 hr tablet Take 2.5 mg by mouth daily with  breakfast.    . metFORMIN (GLUCOPHAGE) 1000 MG tablet Take 1,000 mg by mouth 2 (two) times daily with a meal.    . neomycin-bacitracin-polymyxin (NEOSPORIN) [phone] ointment Apply 1 application topically See admin instructions. Apply to right lower leg daily after cleansing    . Transparent Dressings (TEGADERM) MISC Apply topically See admin instructions. Apply to right lower leg once daily after cleansing/drying (dressing change)    . Wound Cleansers (WOUND/SKIN CLEANSER) LIQD Apply topically See admin instructions. Clean wound on right lower leg daily (part of dressing change regimen)       HOSPITAL MEDICATIONS .  stroke: mapping our early stages of recovery book   Does not apply Once  . chlorhexidine  15 mL Mouth Rinse BID  . Chlorhexidine Gluconate Cloth  6 each Topical Daily  . mouth rinse  15 mL Mouth Rinse q12n4p  . scopolamine  1 patch Transdermal Q72H  . sodium chloride flush  3 mL Intravenous Once    LABORATORY STUDIES CBC    Component Value Date/Time   WBC 17.8 (H) August 24, 2019 0302   RBC 4.49 08/24/2019 0302   HGB 13.6 August 24, 2019 0302   HCT 41.3 24-Aug-2019 0302   PLT 188 08-24-19 0302   MCV 92.0 08-24-2019 0302   MCH 30.3 August 24, 2019 0302   MCHC 32.9 2019/08/24 0302   RDW 15.9 (H) 08-24-19 0302   LYMPHSABS 2.1 08/15/2019 1115   MONOABS 1.0 08/15/2019 1115   EOSABS 0.0 08/15/2019 1115   BASOSABS 0.1 08/15/2019 1115   CMP    Component Value Date/Time   NA 142 08/15/2019 1121   K 3.8 08/15/2019 1121   CL 108 08/15/2019 1121   CO2 19 (L) 08/15/2019 1115   GLUCOSE 341 (H) 08/15/2019 1121   BUN 22 08/15/2019 1121   CREATININE 1.40 (H) 08/15/2019 1121   CALCIUM 8.7 (L) 08/15/2019 1115   PROT 7.0 08/15/2019 1115   ALBUMIN 2.9 (L) 08/15/2019 1115   AST 18 08/15/2019 1115   ALT 13 08/15/2019 1115   ALKPHOS 71 08/15/2019 1115   BILITOT 1.1 08/15/2019 1115   GFRNONAA 39 (L) 08/15/2019 1115   GFRAA 45 (L) 08/15/2019 1115   COAGS Lab Results  Component Value  Date   INR 1.3 (H) 08/15/2019   Lipid Panel    Component Value Date/Time   CHOL 154 2019/08/24 0302   TRIG 96 08-24-19 0302   HDL 39 (L) 08/24/19 0302   CHOLHDL 3.9 Aug 24, 2019 0302   VLDL 19 August 24, 2019 0302   LDLCALC 96 Aug 24, 2019 0302   HgbA1C  Lab Results  Component Value Date   HGBA1C 9.5 (H) 08/24/2019   Urinalysis No results found for: COLORURINE, APPEARANCEUR, LABSPEC, PHURINE, GLUCOSEU, HGBUR, BILIRUBINUR, KETONESUR, PROTEINUR, UROBILINOGEN, NITRITE, LEUKOCYTESUR Urine Drug Screen No results found for: LABOPIA, COCAINSCRNUR, LABBENZ, AMPHETMU, THCU, LABBARB  Alcohol Level No results found for: [facility]   SIGNIFICANT DIAGNOSTIC STUDIES  CT  ANGIO HEAD W OR WO CONTRAST  CT ANGIO NECK W OR WO CONTRAST 08/15/2019 IMPRESSION:  1. Acute occlusion left MCA with poor MCA collateral circulation. In addition, there is diffuse disease of the left internal carotid artery at the skull base and left cavernous segment which may be due to atherosclerotic disease or thrombus. Severe calcific stenosis left cavernous and supraclinoid internal carotid artery. Mild stenosis left internal carotid artery proximally.  2. Mild atherosclerotic disease right carotid bifurcation. Moderate calcific stenosis right cavernous and supraclinoid internal carotid artery. Right middle and right anterior cerebral arteries widely patent.  3. Posterior circulation widely patent  4. Acute pulmonary embolism. Moderate amount of clot in left pulmonary artery. Right pulmonary artery not well evaluated due to patient positioning during the scan.  5. Bilateral thyroid enlargement left greater than right. This may be due to goiter versus neoplasm. Thyroid ultrasound recommended for further evaluation  6. These results were called by telephone at the time of interpretation on 08/15/2019 at 11:52 Pm to provider Amada Jupiter MD, who verbally acknowledged these results.  7. Sclerotic changes throughout the spine which could be  degenerative however metastatic disease is possible. Sclerotic changes in the mandible bilaterally. This may be due to chronic osteitis from dental disease. The patient is edentulous. Correlate with PSA.   CT HEAD WO CONTRAST 13-Sep-2019 IMPRESSION:  Complete left MCA territory infarct with hemorrhagic transformation which has occurred since yesterday. Large amount of edema and mass-effect with 2 cm midline shift to the right.   CT HEAD CODE STROKE WO CONTRAST 08/15/2019 IMPRESSION:  1. Negative for acute infarct or hemorrhage  2. ASPECTS is 10  3. Chronic infarcts in the right parietal lobe and in cerebellum bilaterally.  4. Hyperdense left MCA compatible with acute thrombus.   ECHOCARDIOGRAM COMPLETE 08/15/2019 IMPRESSIONS   1. Left ventricular ejection fraction, by visual estimation, is 55 to 60%. The left ventricle has normal function. Left ventricular septal wall thickness was mildly increased. Mildly increased left ventricular posterior wall thickness. There is mildly increased left ventricular hypertrophy.   2. Elevated left ventricular end-diastolic pressure.   3. Left ventricular diastolic parameters are consistent with Grade II diastolic dysfunction (pseudonormalization).   4. The left ventricle has no regional wall motion abnormalities.   5. Intracavitary gradient noted. Peak velocity 1.76 m/s. Peak gradient 12.4 mmHg. Septal dyskinesis consistent with bundle branch block.   6. Global right ventricle has mildly reduced systolic function.The right ventricular size is normal. No increase in right ventricular wall thickness.   7. Left atrial size was moderately dilated.   8. Right atrial size was normal.   9. The mitral valve is normal in structure. No evidence of mitral valve regurgitation. No evidence of mitral stenosis.  10. The tricuspid valve is normal in structure.  11. The tricuspid valve is normal in structure. Tricuspid valve regurgitation is mild.  12. Aortic valve mean  gradient measures 14.0 mmHg.  13. Aortic valve peak gradient measures 28.1 mmHg.  14. The aortic valve is normal in structure. Aortic valve regurgitation is mild. Mild aortic valve stenosis.  15. The pulmonic valve was normal in structure. Pulmonic valve regurgitation is not visualized.  16. Moderately elevated pulmonary artery systolic pressure.  17. The inferior vena cava is dilated in size with <50% respiratory variability, suggesting right atrial pressure of 15 mmHg.    ECG - SR rate 89 BPM - LBBB (See cardiology reading for complete details)    HISTORY OF PRESENT ILLNESS (From Dr Alene Mires H&P  08/15/19) Paul Arroyo is an 86 y.o. male  PMH hx  CVA  ( old right side deficits) Clots( not on coumadin), DM, HTN who presented to [facility] ED as a code stroke for right facial droop and left side weakness.  Per daughter patient recently moved to [place] to live with her fro [place]. Patient has had multiple CVAs, and has not been able to move his right side since the first stroke. Patient was on coumadin for blood clots ( she is not sure where), but per her report has been off coumadin for the past year. Per daughter patient had trouble swallowing at baseline, was wheelchair bound and unable to perform ADLs wears disposable briefs. Never a smoker, does not drink or do drugs. Very non compliant with medications (but cannot remember all of patient's medications)  he received care at [facility] and [facility] ( attempting to obtain medical records) ED course:  CTH: no hemorrhage, hyperdense left MCA CTA: acute occlusion left MCA with poor MCA collateral Initial BP 194/129, CBG: 347  placed on 5L oxygen via [place] to keep 02 > 93% labetalol 20mg  given cardene drip titrated to starting BP of 164/92 ( manual) Date last known well: 08/15/19 Time last known well: 0900 tPA Given: yes; started at 1153 ; delayed d/t difficulty controlling BP Modified Rankin: Rankin Score=4 [facility]   HOSPITAL  COURSE Paul Arroyo is a 86 y.o. male with history of multiple CVAs since 12(with old right side deficits), unspecific clots (not on coumadin), DM, CAD, and HTN who presented to [facility] ED as a code stroke for right facial droop and left side weakness. He received IV tPA Saturday 08/15/19 at 12:15 AM.  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 ASPECTS is 10. Chronic infarcts in the right parietal lobe and in cerebellum bilaterally. Hyperdense left MCA compatible with acute thrombus  CTA H&N - Acute occlusion left MCA with poor MCA collateral circulation  Not candidate for IR due to premorbid mRS 4  CT head repeat after neuro change - Complete left MCA territory infarct with hemorrhagic transformation which has occurred since yesterday. Large amount of edema and mass-effect with 2 cm midline shift to the right.  2D Echo - EF 55 - 60%. No cardiac source of emboli identified.   Sars Corona Virus 2 - negative  LDL - 96  HgbA1c - 9.5  VTE prophylaxis - SCDs  aspirin 325 mg daily prior to admission, now on No antithrombotic s/p tPA and HT  Disposition: I had long discussion with daughter at bedside, reviewed neuro images, updated pt current condition, treatment plan and potential prognosis, and answered all the questions. She requested comfort care measures. She also wish to take pt home for home hospice ASAP.   Brain herniation  Neuro changes post tPA  CT repeat - left malignant MCA syndrome with HT and dramatic mass effect, MLS 2cm  Poor prognosis  Discussed with daughter and she requested comfort care measures  Acute PE  CTA neck showed Acute pulmonary embolism. Moderate amount of clot in left pulmonary artery. Right pulmonary artery not well evaluated due to patient positioning during the scan.  S/p  tPA  Airway still protected   Now comfort care measures  Hypertension  Home BP meds: Norvasc  Current BP meds: Cleviprex and Labetalol prn  Stable  Post TPA SBP goal initially < 180/105 - now comfort care measures  Hyperlipidemia  Home Lipid lowering medication: Lipitor 80 mg daily   LDL 96, goal < 70  Current lipid lowering medication: none for comfort care measures  Diabetes  Home diabetic meds: Glucotrol and metformin  Current diabetic meds: SSI - d/c for comfort care measures  HgbA1c 9.5, goal < 7.0  Other Stroke Risk Factors  Advanced age  Previous ETOH  Hx stroke/TIA  Family hx stroke (mother)  Coronary artery disease  History of non compliance with medications  Other Active Problems  Sclerotic changes throughout the spine which could be degenerative however metastatic disease is possible  Bilateral thyroid enlargement left greater than right. This may be due to goiter versus neoplasm. Thyroid ultrasound recommended for further evaluation   Leukocytosis - 17.8 (temp - 100.2 -> 97.8)  CKD - creatinine - 1.61->1.4   DISCHARGE EXAM Vitals:   09/01/2019 0815 2019-09-01 0830 2019/09/01 0845 2019/09/01 0900  BP: (!) 157/73 (!) 163/73 (!) 172/80 (!) 166/71  Pulse: 74 78 76 83  Resp: 20 18 16 14   Temp:      TempSrc:      SpO2: 100% 100% 100% 100%  Weight:       General - Well nourished, well developed, obtunded.  Ophthalmologic - fundi not visualized due to noncooperation.  Cardiovascular - Regular rate and rhythm.  Neuro - obtunded, moaning to pain stimulation, eyes closed, following commands, nonverbal. With forced eye opening, eyes in position, not blinking to visual threat, doll's eyes absent, not tracking, PERRL, 71mm->3mm. Corneal reflex weak on the right, absent on the left, gag and cough present. Right facial droop.  Tongue protrusion not cooperative. On pain stimulation, not significant movement of BUEs and RLE, but mild withdraw at LLE.  DTR 1+ and bilateral positive babinski. Sensation, coordination and gait not tested.   DISCHARGE DIET   Diet Order            Diet NPO time specified  Diet effective now             liquids  DISCHARGE PLAN  Disposition:  Discharged to Home Hospice per family request  30 minutes were spent preparing discharge.  Mikey Bussing PA-C [facility] Pager [phone] 01-Sep-2019, 12:15 PM

## 2019-08-24 NOTE — Progress Notes (Signed)
Notified daughter, patient has massive L MCA stroke with hemorrhagic conversion and  Herniation. Notified this is not survivable and that he may pass any moment, she states she is on her way to visit him.  We can initiate comfort care orders once she arrives.

## 2019-08-24 NOTE — Progress Notes (Signed)
STROKE TEAM PROGRESS NOTE   INTERVAL HISTORY His daugher and RN are at the bedside.  Pt eyes closed, not following commands, moaning with pain stimulation, nonverbal. Overnight had neuro changes, stat CT done showed malignant left MCA syndrome, brain herniation, with hemorrhagic conversion and IVH. Poor prognosis. I talked with daughter at length at bedside, reviewed neuro images, updated pt current condition, treatment plan and potential prognosis, and answered all the questions. She requested comfort care measures, and expressed willing to take pt home ASAP. I will contract SW to facilitate.   OBJECTIVE Vitals:   Aug 20, 2019 0400 08-20-2019 0500 08/20/2019 0600 08/20/19 0615  BP: (!) 155/79 (!) 141/79 (!) 166/93 139/72  Pulse: 99 97 89 90  Resp: (!) 28 (!) 27 (!) 24 (!) 23  Temp: 100.2 F (37.9 C) 97.8 F (36.6 C)    TempSrc: Axillary Oral    SpO2: 98% 98% 97% 98%  Weight:        CBC:  Recent Labs  Lab 08/15/19 1115 08/15/19 1121 08/15/19 1845 August 20, 2019 0302  WBC 11.3*  --   --  17.8*  NEUTROABS 8.0*  --   --   --   HGB 14.1   < > 14.4 13.6  HCT 42.6   < > 42.4 41.3  MCV 92.8  --   --  92.0  PLT 229  --   --  188   < > = values in this interval not displayed.    Basic Metabolic Panel:  Recent Labs  Lab 08/15/19 1115 08/15/19 1121  NA 139 142  K 3.9 3.8  CL 107 108  CO2 19*  --   GLUCOSE 351* 341*  BUN 21 22  CREATININE 1.61* 1.40*  CALCIUM 8.7*  --     Lipid Panel:     Component Value Date/Time   CHOL 154 08-20-19 0302   TRIG 96 August 20, 2019 0302   HDL 39 (L) Aug 20, 2019 0302   CHOLHDL 3.9 08/20/19 0302   VLDL 19 08-20-19 0302   LDLCALC 96 08/20/2019 0302   HgbA1c:  Lab Results  Component Value Date   HGBA1C 9.5 (H) 20-Aug-2019   Urine Drug Screen: No results found for: LABOPIA, COCAINSCRNUR, LABBENZ, AMPHETMU, THCU, LABBARB  Alcohol Level No results found for: ETH  IMAGING  CT ANGIO HEAD W OR WO CONTRAST  CT ANGIO NECK W OR WO  CONTRAST 08/15/2019 IMPRESSION:  1. Acute occlusion left MCA with poor MCA collateral circulation. In addition, there is diffuse disease of the left internal carotid artery at the skull base and left cavernous segment which may be due to atherosclerotic disease or thrombus. Severe calcific stenosis left cavernous and supraclinoid internal carotid artery. Mild stenosis left internal carotid artery proximally.  2. Mild atherosclerotic disease right carotid bifurcation. Moderate calcific stenosis right cavernous and supraclinoid internal carotid artery. Right middle and right anterior cerebral arteries widely patent.  3. Posterior circulation widely patent  4. Acute pulmonary embolism. Moderate amount of clot in left pulmonary artery. Right pulmonary artery not well evaluated due to patient positioning during the scan.  5. Bilateral thyroid enlargement left greater than right. This may be due to goiter versus neoplasm. Thyroid ultrasound recommended for further evaluation  6. These results were called by telephone at the time of interpretation on 08/15/2019 at 11:52 Pm to provider Amada Jupiter MD, who verbally acknowledged these results.  7. Sclerotic changes throughout the spine which could be degenerative however metastatic disease is possible. Sclerotic changes in the mandible bilaterally. This may  be due to chronic osteitis from dental disease. The patient is edentulous. Correlate with PSA.   CT HEAD WO CONTRAST 2019-08-24 IMPRESSION:  Complete left MCA territory infarct with hemorrhagic transformation which has occurred since yesterday. Large amount of edema and mass-effect with 2 cm midline shift to the right.   CT HEAD CODE STROKE WO CONTRAST 08/15/2019 IMPRESSION:  1. Negative for acute infarct or hemorrhage  2. ASPECTS is 10  3. Chronic infarcts in the right parietal lobe and in cerebellum bilaterally.  4. Hyperdense left MCA compatible with acute thrombus.   ECHOCARDIOGRAM  COMPLETE 08/15/2019 IMPRESSIONS   1. Left ventricular ejection fraction, by visual estimation, is 55 to 60%. The left ventricle has normal function. Left ventricular septal wall thickness was mildly increased. Mildly increased left ventricular posterior wall thickness. There is mildly increased left ventricular hypertrophy.   2. Elevated left ventricular end-diastolic pressure.   3. Left ventricular diastolic parameters are consistent with Grade II diastolic dysfunction (pseudonormalization).   4. The left ventricle has no regional wall motion abnormalities.   5. Intracavitary gradient noted. Peak velocity 1.76 m/s. Peak gradient 12.4 mmHg. Septal dyskinesis consistent with bundle branch block.   6. Global right ventricle has mildly reduced systolic function.The right ventricular size is normal. No increase in right ventricular wall thickness.   7. Left atrial size was moderately dilated.   8. Right atrial size was normal.   9. The mitral valve is normal in structure. No evidence of mitral valve regurgitation. No evidence of mitral stenosis.  10. The tricuspid valve is normal in structure.  11. The tricuspid valve is normal in structure. Tricuspid valve regurgitation is mild.  12. Aortic valve mean gradient measures 14.0 mmHg.  13. Aortic valve peak gradient measures 28.1 mmHg.  14. The aortic valve is normal in structure. Aortic valve regurgitation is mild. Mild aortic valve stenosis.  15. The pulmonic valve was normal in structure. Pulmonic valve regurgitation is not visualized.  16. Moderately elevated pulmonary artery systolic pressure.  17. The inferior vena cava is dilated in size with <50% respiratory variability, suggesting right atrial pressure of 15 mmHg.    ECG - SR rate 89 BPM - LBBB (See cardiology reading for complete details)   PHYSICAL EXAM  Temp:  [96.9 F (36.1 C)-100.8 F (38.2 C)] 97.8 F (36.6 C) (01/24 0500) Pulse Rate:  [72-122] 83 (01/24 0900) Resp:  [14-32] 14  (01/24 0900) BP: (102-188)/(68-136) 166/71 (01/24 0900) SpO2:  [90 %-100 %] 100 % (01/24 0900) FiO2 (%):  [44 %] 44 % (01/23 1227) Weight:  [96.2 kg] 96.2 kg (01/23 1159)  General - Well nourished, well developed, obtunded.  Ophthalmologic - fundi not visualized due to noncooperation.  Cardiovascular - Regular rate and rhythm.  Neuro - obtunded, moaning to pain stimulation, eyes closed, following commands, nonverbal. With forced eye opening, eyes in position, not blinking to visual threat, doll's eyes absent, not tracking, PERRL, 26mm->3mm. Corneal reflex weak on the right, absent on the left, gag and cough present. Right facial droop.  Tongue protrusion not cooperative. On pain stimulation, not significant movement of BUEs and RLE, but mild withdraw at LLE. DTR 1+ and bilateral positive babinski. Sensation, coordination and gait not tested.   ASSESSMENT/PLAN Mr. Favor Truluck is a 86 y.o. male with history of multiple CVAs since 89 (with old right side deficits), unspecific clots (not on coumadin), DM, CAD, and HTN who presented to [facility] ED as a code stroke for right facial droop and left side  weakness. He received IV tPA Saturday 08/15/19 at 12:15 AM.  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 ASPECTS is 10. Chronic infarcts in the right parietal lobe and in cerebellum bilaterally. Hyperdense left MCA compatible with acute thrombus  CTA H&N - Acute occlusion left MCA with poor MCA collateral circulation  Not candidate for IR due to premorbid mRS 4  CT head repeat after neuro change - Complete left MCA territory infarct with hemorrhagic transformation which has occurred since yesterday. Large amount of edema and mass-effect with 2 cm midline shift to the right.  2D Echo - EF 55 - 60%. No cardiac source of emboli  identified.   Sars Corona Virus 2 - negative  LDL - 96  HgbA1c - 9.5  VTE prophylaxis - SCDs  aspirin 325 mg daily prior to admission, now on No antithrombotic s/p tPA and HT  Disposition: I had long discussion with daughter at bedside, reviewed neuro images, updated pt current condition, treatment plan and potential prognosis, and answered all the questions. She requested comfort care measures. She also wish to take pt home and I will ask SW to arrange ASAP.   Brain herniation  Neuro changes post tPA  CT repeat - left malignant MCA syndrome with HT and dramatic mass effect, MLS 2cm  Poor prognosis  Discussed with daughter and she requested comfort care measures  Acute PE  CTA neck showed Acute pulmonary embolism. Moderate amount of clot in left pulmonary artery. Right pulmonary artery not well evaluated due to patient positioning during the scan.  S/p tPA  Airway still protected   Now comfort care measures  Hypertension  Home BP meds: Norvasc  Current BP meds: Cleviprex and Labetalol prn  Stable  Post TPA SBP goal initially < 180/105 - now comfort care measures  Hyperlipidemia  Home Lipid lowering medication: Lipitor 80 mg daily   LDL 96, goal < 70  Current lipid lowering medication: none for comfort care measures  Diabetes  Home diabetic meds: Glucotrol and metformin  Current diabetic meds: SSI - d/c for comfort care measures  HgbA1c 9.5, goal < 7.0  Other Stroke Risk Factors  Advanced age  Previous ETOH  Hx stroke/TIA  Family hx stroke (mother)  Coronary artery disease  History of non compliance with medications  Other Active Problems  Sclerotic changes throughout the spine which could be degenerative however metastatic disease is possible  Bilateral thyroid enlargement left greater than right. This may be due to goiter versus neoplasm. Thyroid ultrasound recommended for further evaluation   Leukocytosis - 17.8 (temp - 100.2 ->  97.8)  CKD - creatinine - 1.61->1.4  Hospital day # 1  This patient is critically ill due to large left MCA infarct, hemorrhagic conversion, cerebral edema, acute PE and at significant risk of neurological worsening, death form brain herniation, heart failure, respiratory failure, brain death. This patient's care requires constant monitoring of vital signs, hemodynamics, respiratory and cardiac monitoring, review of multiple databases, neurological assessment, discussion with family, other specialists and medical decision making of high complexity. I talked with daughter at length at bedside, reviewed neuro images, updated pt current condition, treatment plan and potential prognosis, and answered all the questions. She requested comfort care measures, and expressed willing to take pt home ASAP. I will contract SW to facilitate. I spent 45 minutes of neurocritical  care time in the care of this patient.  Marvel Plan, MD PhD Stroke Neurology 09-13-19 10:38 AM   To contact Stroke Continuity provider, please refer to [email or web address]. After hours, contact General Neurology

## 2019-08-24 DEATH — deceased

## 2021-05-18 IMAGING — CT CT ANGIO NECK
1 of 7 series · 12 of 46 positions shown, 16 images · IV contrast (OMNI)
Comparison: CT head 08/15/2019

CLINICAL DATA: Stroke

EXAM:
CT ANGIOGRAPHY HEAD AND NECK
TECHNIQUE: Multidetector CT imaging of the head and neck was performed using
the standard protocol during bolus administration of intravenous
contrast. Multiplanar CT image reconstructions and MIPs were
obtained to evaluate the vascular anatomy. Carotid stenosis
measurements (when applicable) are obtained utilizing NASCET
criteria, using the distal internal carotid diameter as the
denominator.
CONTRAST:  75mL OMNIPAQUE IOHEXOL 350 MG/ML SOLN 75 mL Omnipaque 350
IV

[Series 12: thin · axial · 0.50mm/px · z∈[-300,+6]mm · 12 of 705 slices shown, 16 images]
[im 62/705  soft-tissue]
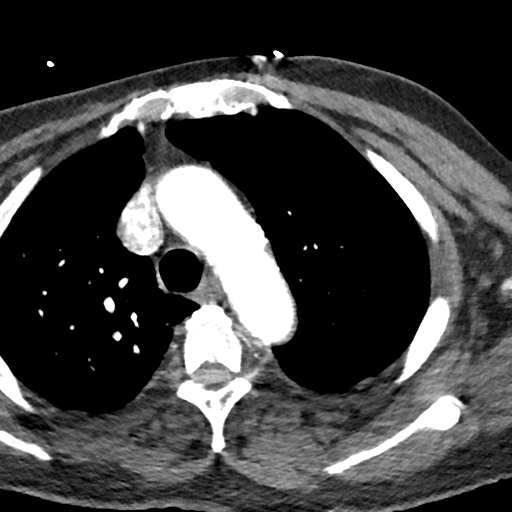
[im 62/705  bone]
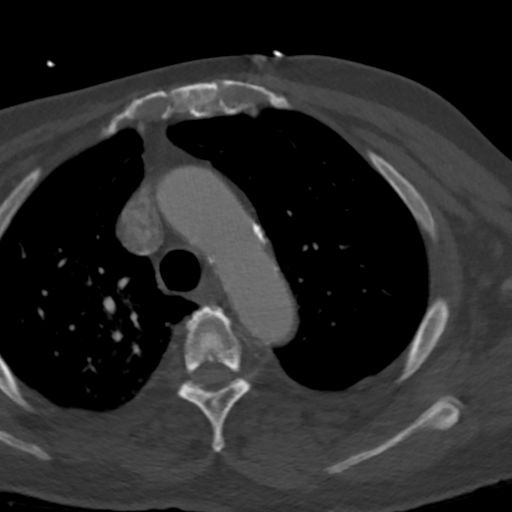
[im 123/705  soft-tissue]
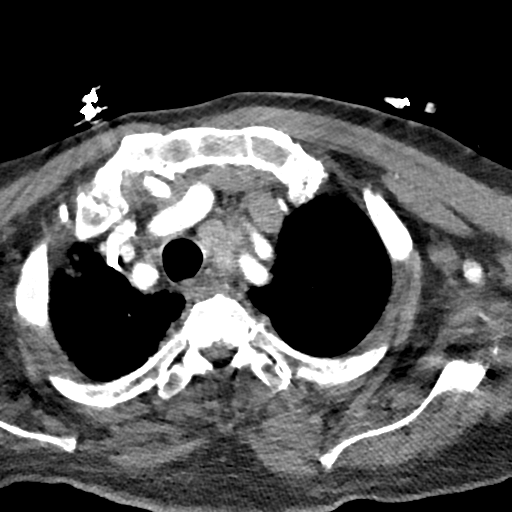
[im 184/705  soft-tissue]
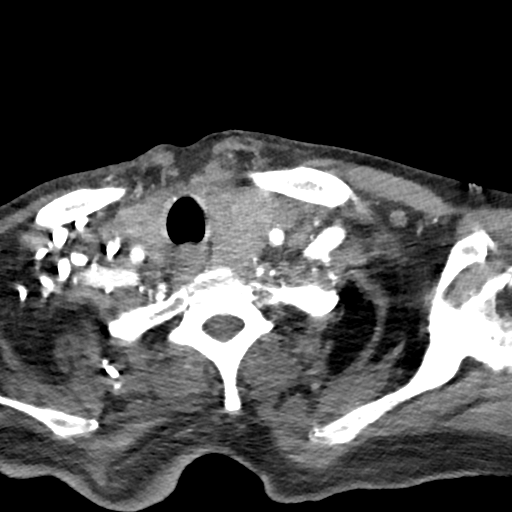
[im 245/705  soft-tissue]
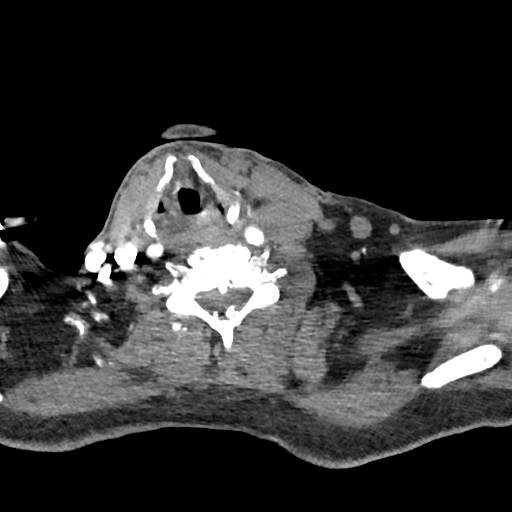
[im 307/705  soft-tissue]
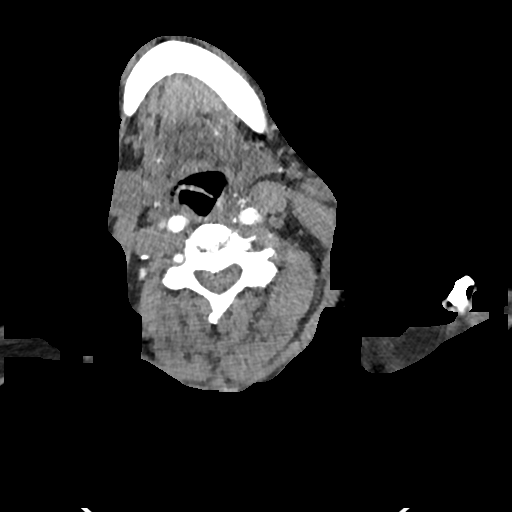
[im 398/705  soft-tissue]
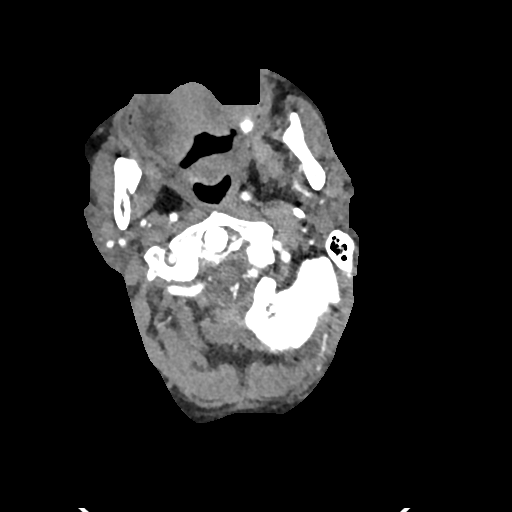
[im 460/705  soft-tissue]
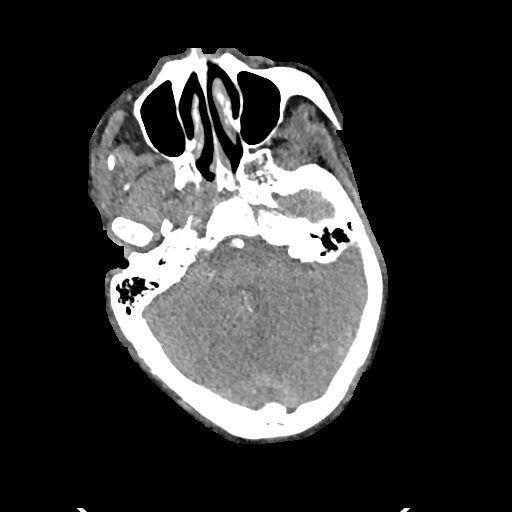
[im 521/705  soft-tissue]
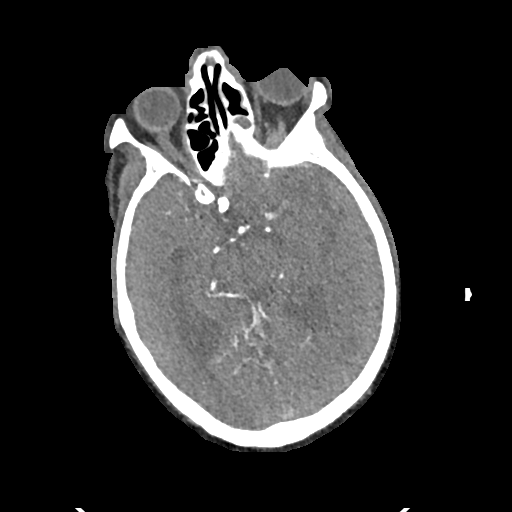
[im 582/705  soft-tissue]
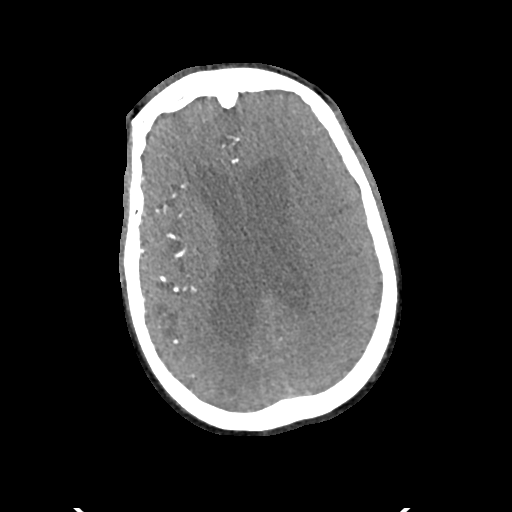
[im 582/705  lung]
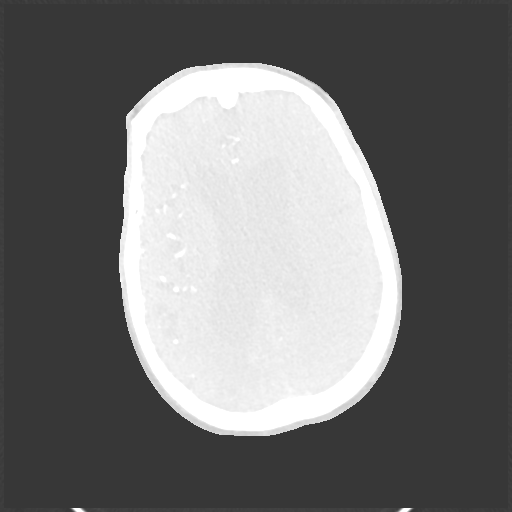
[im 582/705  bone]
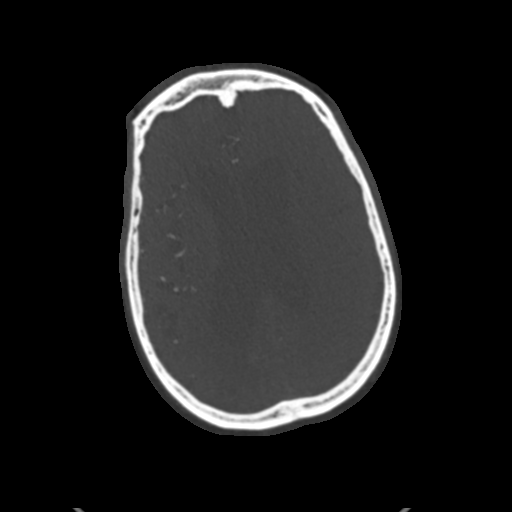
[im 613/705  lung]
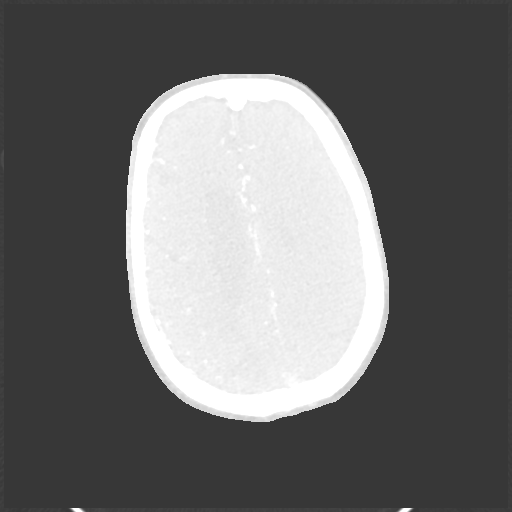
[im 643/705  soft-tissue]
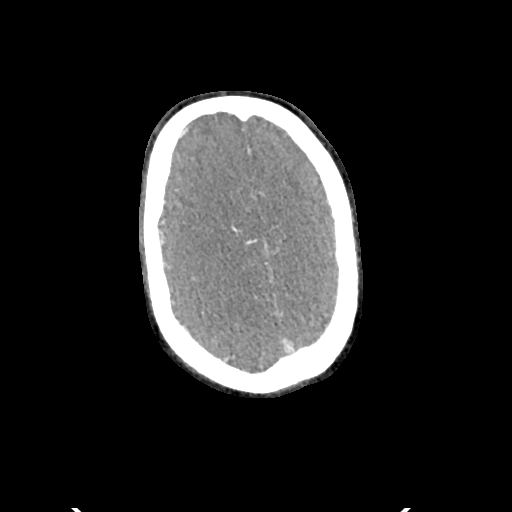
[im 643/705  lung]
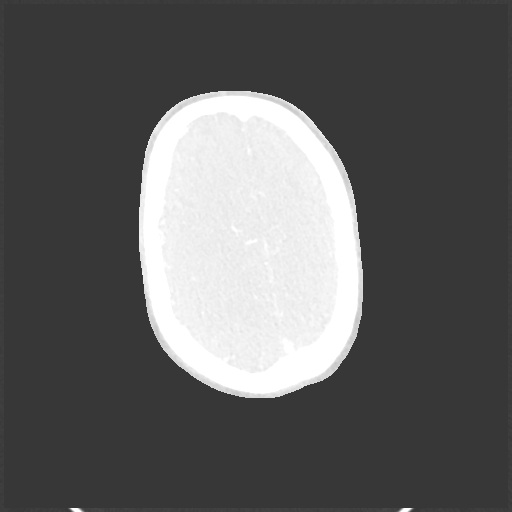
[im 674/705  lung]
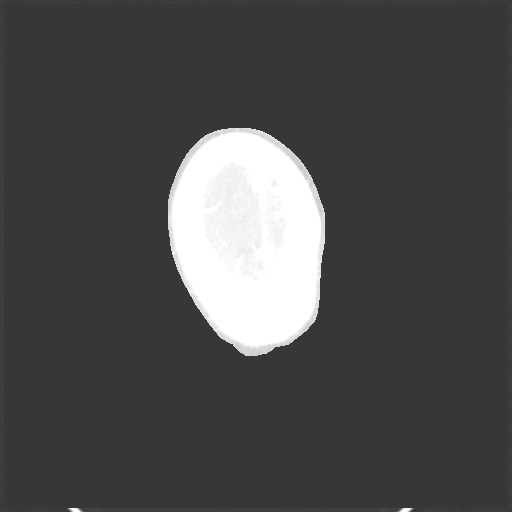

[12 of 46 positions shown; findings below may reference images not displayed]

FINDINGS: CTA NECK FINDINGS

Aortic arch: Extensive atherosclerotic calcification aortic arch
without dissection or aneurysm. Mild stenosis proximal left
subclavian artery due to calcific stenosis.

Right carotid system: Mild atherosclerotic calcification right
carotid bifurcation without significant stenosis.

Left carotid system: Left common carotid artery widely patent. Mild
atherosclerotic disease left carotid bifurcation without significant
stenosis. There is irregularity and decreased caliber of the left
internal carotid artery as it approaches the skull base. Moderate to
severe stenosis at the skull base. Severe calcific stenosis of the
left cavernous and supraclinoid internal carotid artery on the left.

Vertebral arteries: Both vertebral arteries patent to the basilar.
Moderate stenosis in the mid vertebral artery bilaterally due to
atherosclerotic disease.

Skeleton: Cervical spondylosis. Negative for fracture. Patchy
sclerotic changes in the vertebra diffusely. Sclerotic changes in
the mandible bilaterally.

Other neck: Moderate to marked enlargement of the left thyroid. Due
to early arterial phase scanning, evaluation for nodule is
difficult. There does appear to be at least a 15 mm nodule in the
left lobe. Left lobe extends into the superior mediastinum. Right
lobe mildly enlarged.

Upper chest: Acute pulmonary embolism. Right pulmonary artery not
scanned. Moderate pulmonary embolism left main pulmonary artery.

Review of the MIP images confirms the above findings

CTA HEAD FINDINGS

Anterior circulation: Atherosclerotic calcification and moderate
stenosis right internal carotid artery in the cavernous and
supraclinoid segment. Right middle cerebral artery widely patent.
Right anterior cerebral artery widely patent

Small caliber left cavernous carotid which is diffusely diseased.
Severe calcific stenosis terminal left internal carotid artery.
Acute occlusion left M1 segment. Poor collateral circulation with no
filling of left MCA branches. Left anterior cerebral artery patent.

Posterior circulation: Both vertebral arteries patent to the
basilar. Mild stenosis distal vertebral artery bilaterally. PICA
patent bilaterally. Basilar widely patent. Posterior cerebral
arteries patent bilaterally without stenosis.

Venous sinuses: Limited venous contrast due to arterial phase
scanning

Anatomic variants: None

Review of the MIP images confirms the above findings
IMPRESSION: 1. Acute occlusion left MCA with poor MCA collateral circulation. In
addition, there is diffuse disease of the left internal carotid
artery at the skull base and left cavernous segment which may be due
to atherosclerotic disease or thrombus. Severe calcific stenosis
left cavernous and supraclinoid internal carotid artery. Mild
stenosis left internal carotid artery proximally.
2. Mild atherosclerotic disease right carotid bifurcation. Moderate
calcific stenosis right cavernous and supraclinoid internal carotid
artery. Right middle and right anterior cerebral arteries widely
patent.
3. Posterior circulation widely patent
4. Acute pulmonary embolism. Moderate amount of clot in left
pulmonary artery. Right pulmonary artery not well evaluated due to
patient positioning during the scan.
5. Bilateral thyroid enlargement left greater than right. This may
be due to goiter versus neoplasm. Thyroid ultrasound recommended for
further evaluation
6. These results were called by telephone at the time of
interpretation on 08/15/2019 at [DATE] to provider Utin Ponne,
who verbally acknowledged these results.
7. Sclerotic changes throughout the spine which could be
degenerative however metastatic disease is possible. Sclerotic
changes in the mandible bilaterally. This may be due to chronic
osteitis from dental disease. The patient is edentulous. Correlate
with PSA.

## 2021-05-19 IMAGING — CT CT HEAD W/O CM
4 series · 17 of 47 positions shown, 19 images · non-contrast
Comparison: CT head 08/15/2019

CLINICAL DATA: Stroke.  Decreased mental status.  Post tPA.

EXAM:
CT HEAD WITHOUT CONTRAST
TECHNIQUE: Contiguous axial images were obtained from the base of the skull
through the vertex without intravenous contrast.

[Series 3: head wo · axial · 0.46mm/px · z∈[+1154,+1284]mm · 7 of 36 slices shown, 9 images]
[im 5/36  brain]
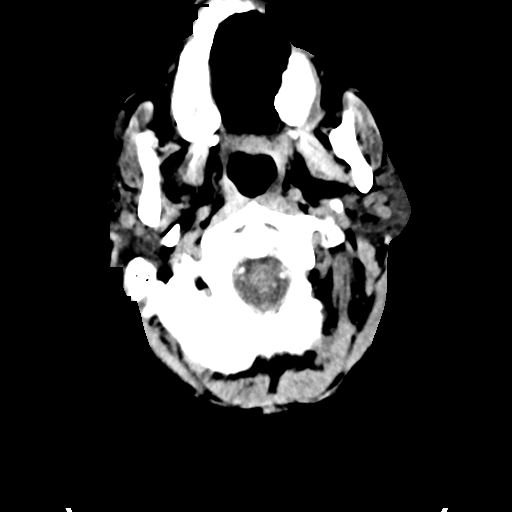
[im 5/36  bone]
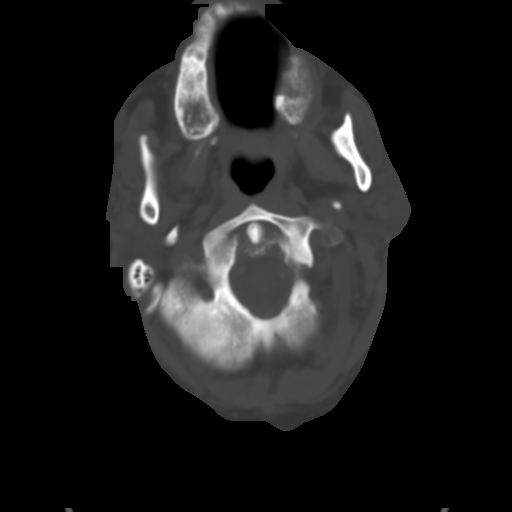
[im 9/36  brain]
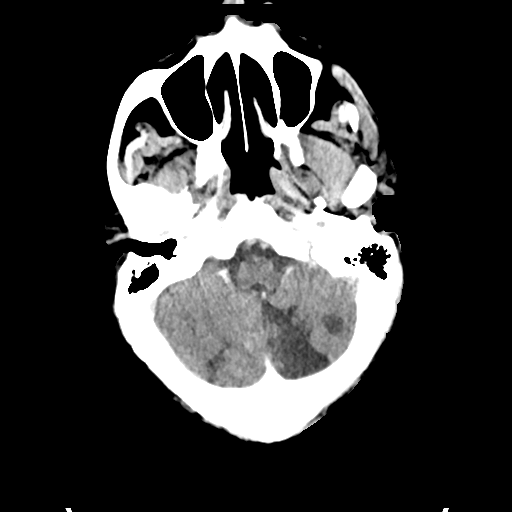
[im 14/36  brain]
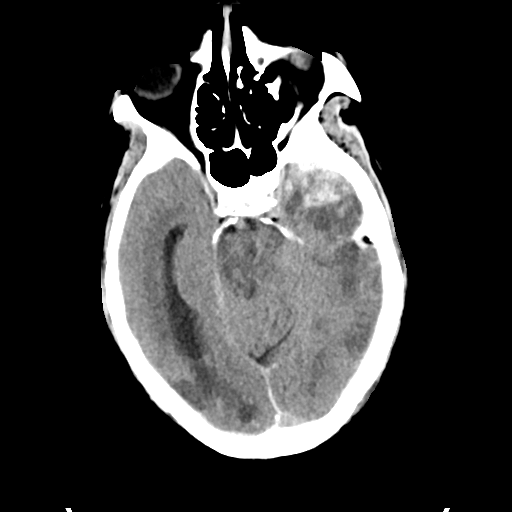
[im 18/36  brain]
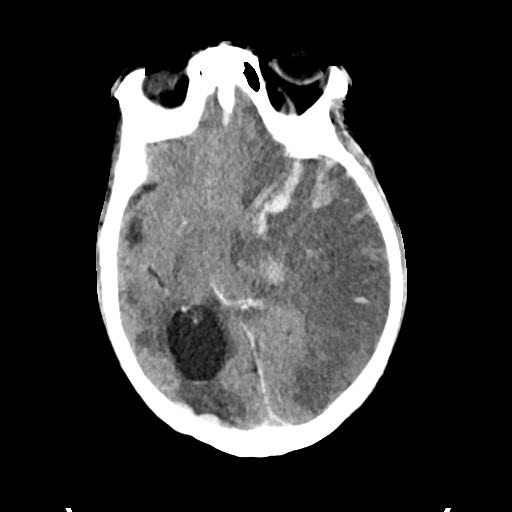
[im 22/36  brain]
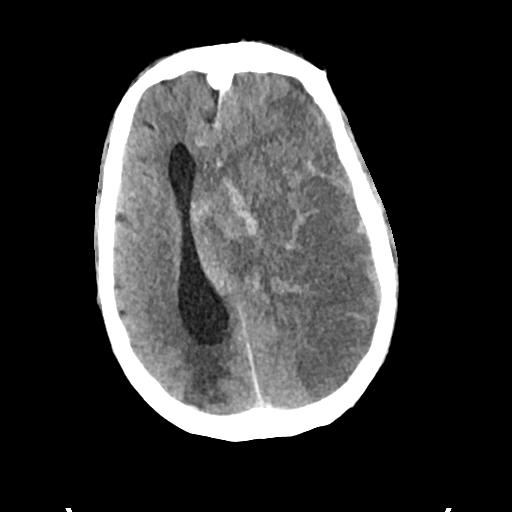
[im 22/36  bone]
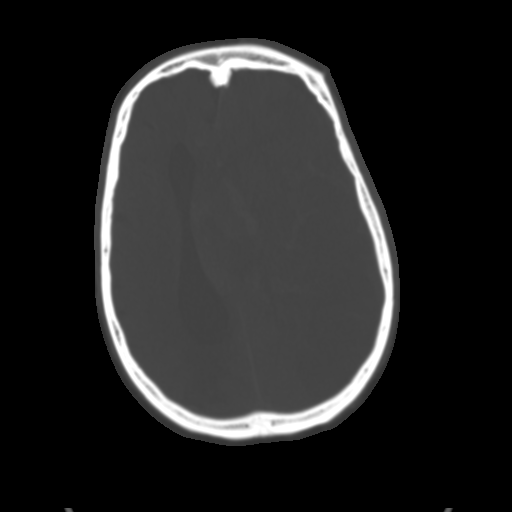
[im 27/36  brain]
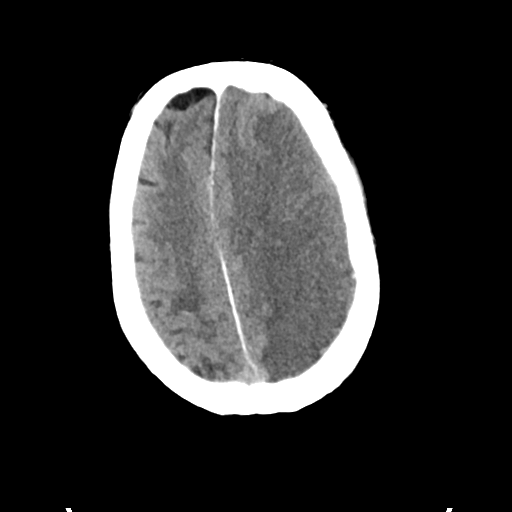
[im 31/36  brain]
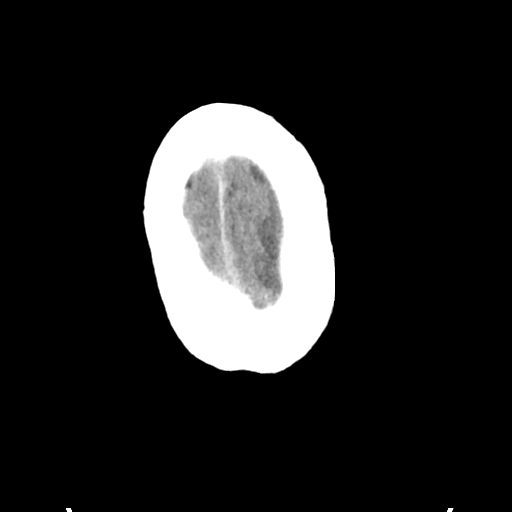

[Series 4: head bone · axial · 0.46mm/px · z∈[+1150,+1214]mm · 4 of 90 slices shown]
[im 9/90  bone]
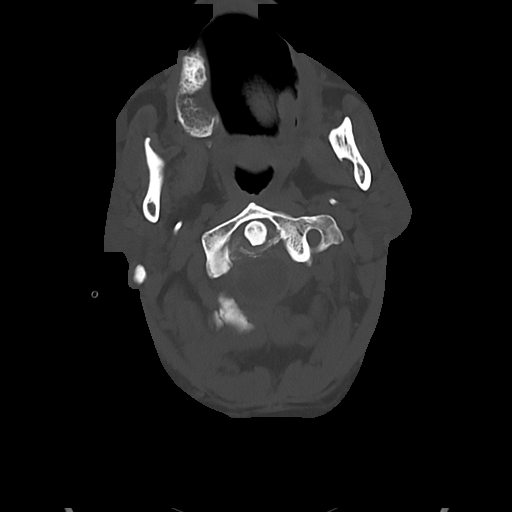
[im 18/90  bone]
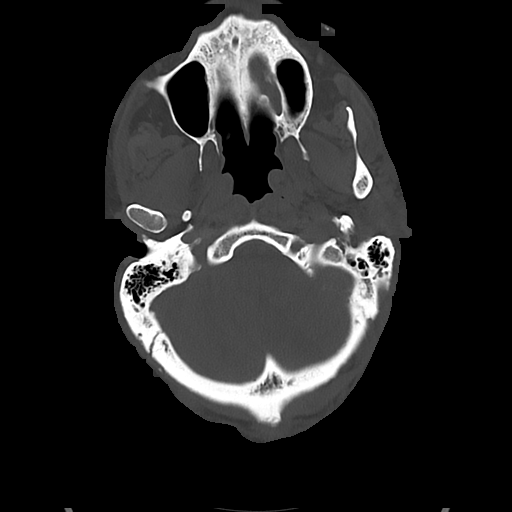
[im 27/90  bone]
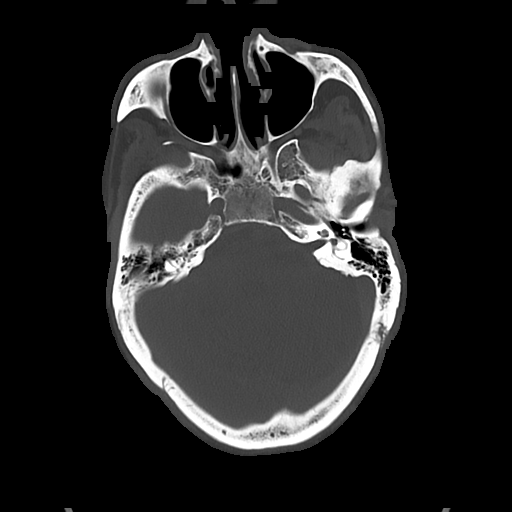
[im 41/90  bone]
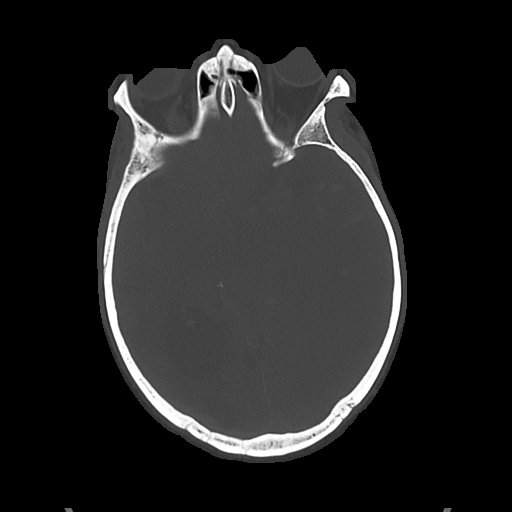

[Series 5: cor soft · coronal · 0.37mm/px · 3 of 74 slices shown]
[im 25/74  brain]
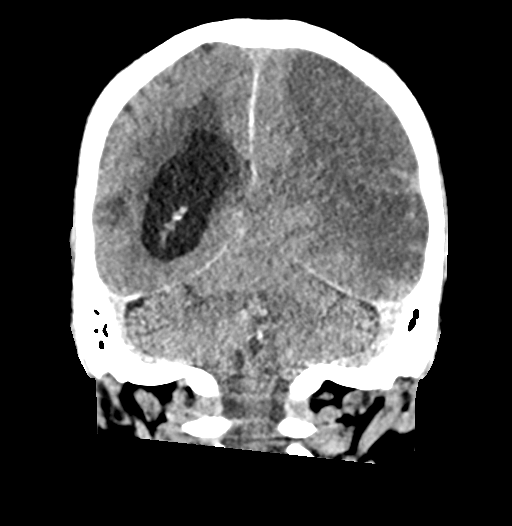
[im 33/74  brain]
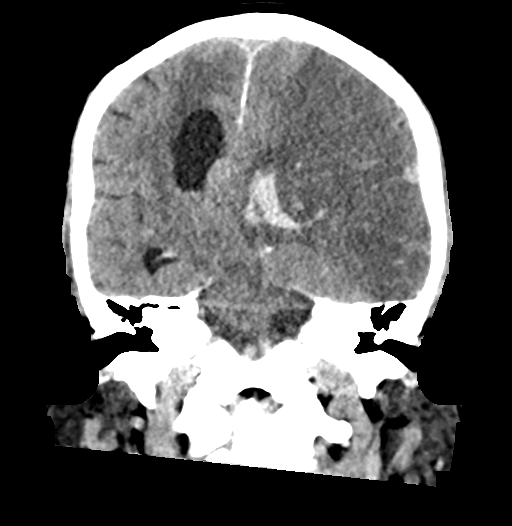
[im 41/74  brain]
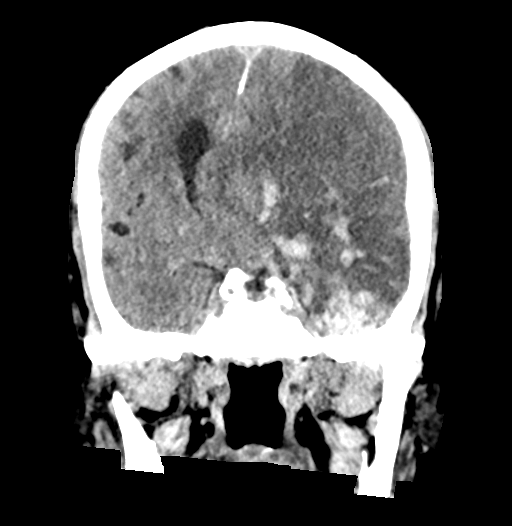

[Series 6: sag soft · sagittal · 0.38mm/px · 3 of 61 slices shown]
[im 21/61  brain]
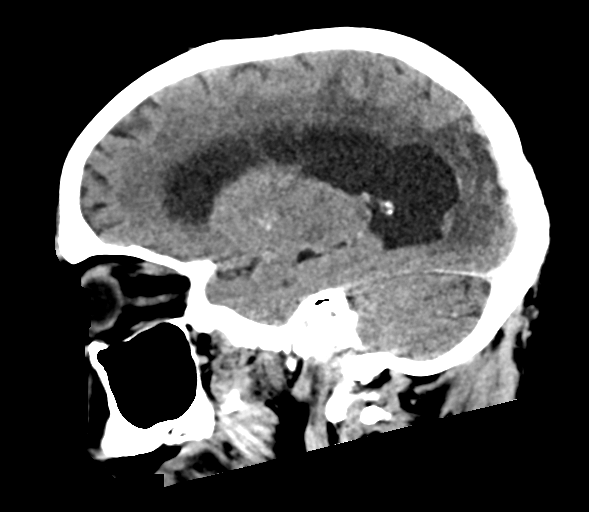
[im 31/61  brain]
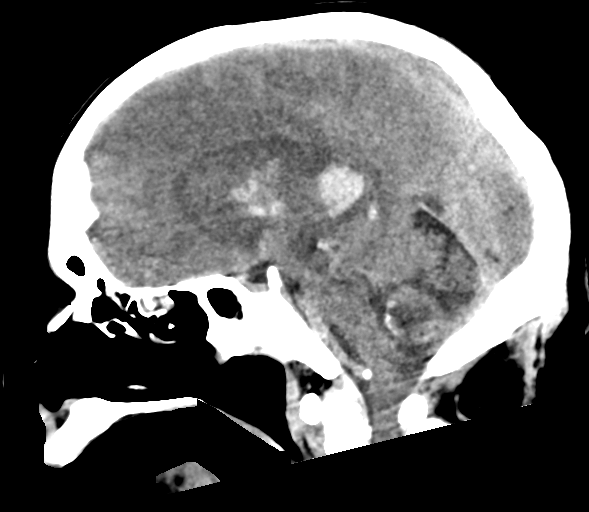
[im 41/61  brain]
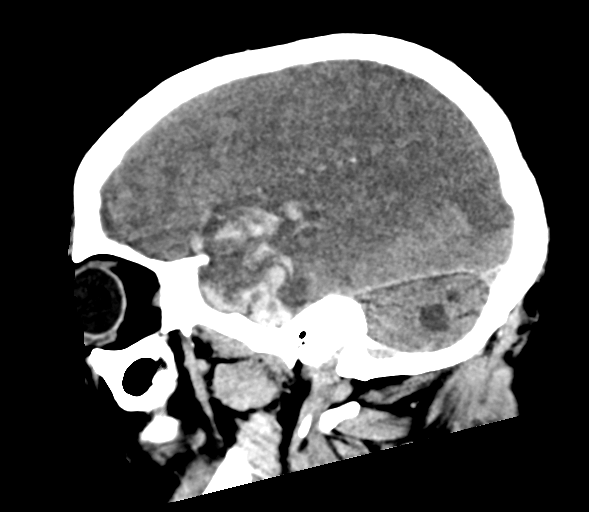

[17 of 47 positions shown; findings below may reference images not displayed]

FINDINGS: Brain: Large territory left MCA infarct shows progressive edema and
mass-effect. Superimposed hemorrhagic transformation has occurred
since yesterday. There is hemorrhage throughout the infarct
including left temporal, frontal lobe, and left basal ganglia. There
is marked compression left lateral ventricle with trapping of the
right lateral ventricle. Small amount of blood in the right lateral
ventricle. Approximately 2 cm midline shift to the right.

Chronic infarcts in the cerebellum bilaterally.

Vascular: Negative for hyperdense vessel. Left MCA obscured by
hemorrhage in edema.

Skull: No acute skeletal abnormality.

Sinuses/Orbits: Negative

Other: None
IMPRESSION: Complete left MCA territory infarct with hemorrhagic transformation
which has occurred since yesterday. Large amount of edema and
mass-effect with 2 cm midline shift to the right.

These results were called by telephone at the time of interpretation
acknowledged these results.

## 8716-03-23 DEATH — deceased
